# Patient Record
Sex: Female | Born: 1955
Health system: Southern US, Community
[De-identification: ages and names within clinical notes are randomized; demographics above are authoritative.]

## PROBLEM LIST (undated history)

## (undated) ENCOUNTER — Ambulatory Visit: Admission: EM | Discharge status: Absent without leave | Payer: Medicare Other

## (undated) DIAGNOSIS — M329 Systemic lupus erythematosus, unspecified: Secondary | ICD-10-CM

## (undated) DIAGNOSIS — I10 Essential (primary) hypertension: Secondary | ICD-10-CM

## (undated) DIAGNOSIS — R51 Headache: Secondary | ICD-10-CM

## (undated) DIAGNOSIS — L719 Rosacea, unspecified: Secondary | ICD-10-CM

## (undated) DIAGNOSIS — M609 Myositis, unspecified: Secondary | ICD-10-CM

## (undated) DIAGNOSIS — IMO0002 Reserved for concepts with insufficient information to code with codable children: Secondary | ICD-10-CM

## (undated) DIAGNOSIS — N189 Chronic kidney disease, unspecified: Secondary | ICD-10-CM

## (undated) HISTORY — DX: Systemic lupus erythematosus, unspecified: M32.9

## (undated) HISTORY — DX: Myositis, unspecified: M60.9

## (undated) HISTORY — DX: Reserved for concepts with insufficient information to code with codable children: IMO0002

## (undated) HISTORY — DX: Headache: R51

## (undated) HISTORY — PX: ABDOMINAL HYSTERECTOMY: SHX81

## (undated) HISTORY — DX: Essential (primary) hypertension: I10

## (undated) HISTORY — DX: Rosacea, unspecified: L71.9

## (undated) HISTORY — DX: Chronic kidney disease, unspecified: N18.9

## (undated) HISTORY — PX: TONSILLECTOMY: SUR1361

---

## 1998-01-14 ENCOUNTER — Ambulatory Visit (HOSPITAL_COMMUNITY): Admission: RE | Admit: 1998-01-14 | Discharge: 1998-01-14 | Payer: Self-pay | Admitting: Obstetrics and Gynecology

## 1998-07-12 ENCOUNTER — Inpatient Hospital Stay (HOSPITAL_COMMUNITY): Admission: AD | Admit: 1998-07-12 | Discharge: 1998-07-15 | Payer: Self-pay | Admitting: Obstetrics and Gynecology

## 1999-05-14 ENCOUNTER — Other Ambulatory Visit: Admission: RE | Admit: 1999-05-14 | Discharge: 1999-05-14 | Payer: Self-pay | Admitting: Obstetrics and Gynecology

## 2000-06-02 ENCOUNTER — Other Ambulatory Visit: Admission: RE | Admit: 2000-06-02 | Discharge: 2000-06-02 | Payer: Self-pay | Admitting: Obstetrics and Gynecology

## 2000-06-14 ENCOUNTER — Emergency Department (HOSPITAL_COMMUNITY): Admission: EM | Admit: 2000-06-14 | Discharge: 2000-06-14 | Payer: Self-pay | Admitting: Emergency Medicine

## 2001-07-20 ENCOUNTER — Other Ambulatory Visit: Admission: RE | Admit: 2001-07-20 | Discharge: 2001-07-20 | Payer: Self-pay | Admitting: Obstetrics and Gynecology

## 2002-03-21 ENCOUNTER — Emergency Department (HOSPITAL_COMMUNITY): Admission: EM | Admit: 2002-03-21 | Discharge: 2002-03-22 | Payer: Self-pay | Admitting: Emergency Medicine

## 2002-03-22 ENCOUNTER — Encounter: Payer: Self-pay | Admitting: Emergency Medicine

## 2002-03-27 ENCOUNTER — Ambulatory Visit (HOSPITAL_COMMUNITY): Admission: RE | Admit: 2002-03-27 | Discharge: 2002-03-27 | Payer: Self-pay | Admitting: Internal Medicine

## 2002-04-14 ENCOUNTER — Emergency Department (HOSPITAL_COMMUNITY): Admission: EM | Admit: 2002-04-14 | Discharge: 2002-04-14 | Payer: Self-pay | Admitting: Emergency Medicine

## 2002-04-14 ENCOUNTER — Encounter: Payer: Self-pay | Admitting: Emergency Medicine

## 2002-07-12 ENCOUNTER — Encounter: Admission: RE | Admit: 2002-07-12 | Discharge: 2002-08-17 | Payer: Self-pay

## 2002-08-02 ENCOUNTER — Other Ambulatory Visit: Admission: RE | Admit: 2002-08-02 | Discharge: 2002-08-02 | Payer: Self-pay | Admitting: Obstetrics and Gynecology

## 2002-08-05 ENCOUNTER — Emergency Department (HOSPITAL_COMMUNITY): Admission: EM | Admit: 2002-08-05 | Discharge: 2002-08-05 | Payer: Self-pay

## 2003-02-22 ENCOUNTER — Encounter: Payer: Self-pay | Admitting: Emergency Medicine

## 2003-02-22 ENCOUNTER — Inpatient Hospital Stay (HOSPITAL_COMMUNITY): Admission: EM | Admit: 2003-02-22 | Discharge: 2003-02-24 | Payer: Self-pay | Admitting: Emergency Medicine

## 2003-02-22 ENCOUNTER — Emergency Department (HOSPITAL_COMMUNITY): Admission: EM | Admit: 2003-02-22 | Discharge: 2003-02-22 | Payer: Self-pay | Admitting: Emergency Medicine

## 2003-03-06 ENCOUNTER — Encounter: Admission: RE | Admit: 2003-03-06 | Discharge: 2003-03-06 | Payer: Self-pay | Admitting: Family Medicine

## 2003-09-06 ENCOUNTER — Other Ambulatory Visit: Admission: RE | Admit: 2003-09-06 | Discharge: 2003-09-06 | Payer: Self-pay | Admitting: Obstetrics and Gynecology

## 2004-10-01 ENCOUNTER — Other Ambulatory Visit: Admission: RE | Admit: 2004-10-01 | Discharge: 2004-10-01 | Payer: Self-pay | Admitting: Obstetrics and Gynecology

## 2005-10-16 ENCOUNTER — Emergency Department (HOSPITAL_COMMUNITY): Admission: EM | Admit: 2005-10-16 | Discharge: 2005-10-16 | Payer: Self-pay | Admitting: Emergency Medicine

## 2007-12-02 ENCOUNTER — Ambulatory Visit (HOSPITAL_COMMUNITY): Payer: Self-pay | Admitting: Psychiatry

## 2007-12-20 ENCOUNTER — Ambulatory Visit (HOSPITAL_BASED_OUTPATIENT_CLINIC_OR_DEPARTMENT_OTHER): Admission: RE | Admit: 2007-12-20 | Discharge: 2007-12-20 | Payer: Self-pay | Admitting: Orthopedic Surgery

## 2008-01-06 ENCOUNTER — Ambulatory Visit (HOSPITAL_COMMUNITY): Payer: Self-pay | Admitting: Psychiatry

## 2008-03-07 ENCOUNTER — Ambulatory Visit (HOSPITAL_COMMUNITY): Payer: Self-pay | Admitting: Psychiatry

## 2008-03-26 ENCOUNTER — Emergency Department (HOSPITAL_COMMUNITY): Admission: EM | Admit: 2008-03-26 | Discharge: 2008-03-27 | Payer: Self-pay | Admitting: Emergency Medicine

## 2008-05-09 ENCOUNTER — Ambulatory Visit (HOSPITAL_COMMUNITY): Payer: Self-pay | Admitting: Psychiatry

## 2008-06-23 ENCOUNTER — Emergency Department (HOSPITAL_COMMUNITY): Admission: EM | Admit: 2008-06-23 | Discharge: 2008-06-23 | Payer: Self-pay | Admitting: Emergency Medicine

## 2008-07-09 ENCOUNTER — Ambulatory Visit (HOSPITAL_COMMUNITY): Payer: Self-pay | Admitting: Psychiatry

## 2008-09-26 ENCOUNTER — Ambulatory Visit (HOSPITAL_COMMUNITY): Payer: Self-pay | Admitting: Psychiatry

## 2008-11-21 ENCOUNTER — Ambulatory Visit (HOSPITAL_COMMUNITY): Payer: Self-pay | Admitting: Psychiatry

## 2009-01-21 ENCOUNTER — Ambulatory Visit (HOSPITAL_COMMUNITY): Payer: Self-pay | Admitting: Psychiatry

## 2009-03-13 ENCOUNTER — Ambulatory Visit (HOSPITAL_COMMUNITY): Payer: Self-pay | Admitting: Psychiatry

## 2009-05-08 ENCOUNTER — Ambulatory Visit (HOSPITAL_COMMUNITY): Payer: Self-pay | Admitting: Psychiatry

## 2009-07-12 ENCOUNTER — Ambulatory Visit (HOSPITAL_COMMUNITY): Payer: Self-pay | Admitting: Psychiatry

## 2009-09-11 ENCOUNTER — Ambulatory Visit (HOSPITAL_COMMUNITY): Payer: Self-pay | Admitting: Psychiatry

## 2009-11-13 ENCOUNTER — Ambulatory Visit (HOSPITAL_COMMUNITY): Payer: Self-pay | Admitting: Psychiatry

## 2010-01-06 ENCOUNTER — Ambulatory Visit (HOSPITAL_COMMUNITY): Payer: Self-pay | Admitting: Psychiatry

## 2010-04-07 ENCOUNTER — Ambulatory Visit (HOSPITAL_COMMUNITY): Payer: Self-pay | Admitting: Psychiatry

## 2010-05-19 ENCOUNTER — Encounter
Admission: RE | Admit: 2010-05-19 | Discharge: 2010-08-17 | Payer: Self-pay | Source: Home / Self Care | Attending: Physical Medicine and Rehabilitation | Admitting: Physical Medicine and Rehabilitation

## 2010-05-19 ENCOUNTER — Encounter
Admission: RE | Admit: 2010-05-19 | Discharge: 2010-05-19 | Payer: Self-pay | Admitting: Physical Medicine & Rehabilitation

## 2010-05-23 ENCOUNTER — Ambulatory Visit: Payer: Self-pay | Admitting: Physical Medicine and Rehabilitation

## 2010-05-28 ENCOUNTER — Emergency Department (HOSPITAL_COMMUNITY): Admission: EM | Admit: 2010-05-28 | Discharge: 2010-05-28 | Payer: Self-pay | Admitting: Emergency Medicine

## 2010-06-09 ENCOUNTER — Ambulatory Visit (HOSPITAL_COMMUNITY): Payer: Self-pay | Admitting: Psychiatry

## 2010-06-20 ENCOUNTER — Ambulatory Visit: Payer: Self-pay | Admitting: Physical Medicine and Rehabilitation

## 2010-07-28 ENCOUNTER — Ambulatory Visit: Payer: Self-pay | Admitting: Physical Medicine and Rehabilitation

## 2010-08-13 ENCOUNTER — Ambulatory Visit (HOSPITAL_COMMUNITY): Payer: Self-pay | Admitting: Psychiatry

## 2010-08-18 ENCOUNTER — Encounter
Admission: RE | Admit: 2010-08-18 | Discharge: 2010-08-26 | Payer: Self-pay | Source: Home / Self Care | Attending: Physical Medicine and Rehabilitation | Admitting: Physical Medicine and Rehabilitation

## 2010-08-26 ENCOUNTER — Ambulatory Visit: Payer: Self-pay | Admitting: Physical Medicine and Rehabilitation

## 2010-10-01 ENCOUNTER — Ambulatory Visit: Payer: Medicare Other | Attending: Physical Medicine and Rehabilitation

## 2010-10-01 ENCOUNTER — Ambulatory Visit: Admit: 2010-10-01 | Payer: Self-pay | Admitting: Physical Medicine and Rehabilitation

## 2010-10-01 ENCOUNTER — Ambulatory Visit: Payer: Medicare Other | Admitting: Physical Medicine and Rehabilitation

## 2010-10-01 DIAGNOSIS — G4700 Insomnia, unspecified: Secondary | ICD-10-CM | POA: Insufficient documentation

## 2010-10-01 DIAGNOSIS — M3320 Polymyositis, organ involvement unspecified: Secondary | ICD-10-CM | POA: Insufficient documentation

## 2010-10-01 DIAGNOSIS — M545 Low back pain, unspecified: Secondary | ICD-10-CM | POA: Insufficient documentation

## 2010-10-01 DIAGNOSIS — M810 Age-related osteoporosis without current pathological fracture: Secondary | ICD-10-CM | POA: Insufficient documentation

## 2010-10-01 DIAGNOSIS — F329 Major depressive disorder, single episode, unspecified: Secondary | ICD-10-CM | POA: Insufficient documentation

## 2010-10-01 DIAGNOSIS — M542 Cervicalgia: Secondary | ICD-10-CM

## 2010-10-01 DIAGNOSIS — Z79899 Other long term (current) drug therapy: Secondary | ICD-10-CM | POA: Insufficient documentation

## 2010-10-01 DIAGNOSIS — F411 Generalized anxiety disorder: Secondary | ICD-10-CM | POA: Insufficient documentation

## 2010-10-01 DIAGNOSIS — M329 Systemic lupus erythematosus, unspecified: Secondary | ICD-10-CM | POA: Insufficient documentation

## 2010-10-01 DIAGNOSIS — G894 Chronic pain syndrome: Secondary | ICD-10-CM

## 2010-10-01 DIAGNOSIS — F3289 Other specified depressive episodes: Secondary | ICD-10-CM | POA: Insufficient documentation

## 2010-10-01 DIAGNOSIS — G35 Multiple sclerosis: Secondary | ICD-10-CM | POA: Insufficient documentation

## 2010-10-15 ENCOUNTER — Encounter (HOSPITAL_COMMUNITY): Payer: Self-pay | Admitting: Psychiatry

## 2010-10-28 ENCOUNTER — Encounter: Payer: Medicare Other | Attending: Physical Medicine and Rehabilitation

## 2010-10-28 ENCOUNTER — Ambulatory Visit: Payer: Medicare Other

## 2010-10-28 DIAGNOSIS — M129 Arthropathy, unspecified: Secondary | ICD-10-CM | POA: Insufficient documentation

## 2010-10-28 DIAGNOSIS — M542 Cervicalgia: Secondary | ICD-10-CM | POA: Insufficient documentation

## 2010-10-28 DIAGNOSIS — M545 Low back pain, unspecified: Secondary | ICD-10-CM

## 2010-10-28 DIAGNOSIS — M329 Systemic lupus erythematosus, unspecified: Secondary | ICD-10-CM | POA: Insufficient documentation

## 2010-10-28 DIAGNOSIS — M3320 Polymyositis, organ involvement unspecified: Secondary | ICD-10-CM | POA: Insufficient documentation

## 2010-10-28 DIAGNOSIS — Z79899 Other long term (current) drug therapy: Secondary | ICD-10-CM | POA: Insufficient documentation

## 2010-10-28 DIAGNOSIS — G894 Chronic pain syndrome: Secondary | ICD-10-CM

## 2010-10-28 DIAGNOSIS — G35 Multiple sclerosis: Secondary | ICD-10-CM | POA: Insufficient documentation

## 2010-10-28 DIAGNOSIS — M79609 Pain in unspecified limb: Secondary | ICD-10-CM

## 2010-10-29 ENCOUNTER — Encounter (HOSPITAL_COMMUNITY): Payer: Medicare Other | Admitting: Psychiatry

## 2010-10-29 DIAGNOSIS — F329 Major depressive disorder, single episode, unspecified: Secondary | ICD-10-CM

## 2010-11-17 NOTE — Assessment & Plan Note (Signed)
Jackie Goodman is a pleasant 55 year old African American woman who is the patient of Dr. Mirna Mires.  [name] has returned to our [facility] for refill of her Percocet.  She has a complicated pain history which includes the diagnosis of polymyositis, systemic lupus erythematosus, as well as multiple sclerosis.  She also had a recent fall on May 27, 2010, when she was walking in the parking lot of the grocery store and she slipped and fell.  She landed on her hands and twisted and fell on her right knee as well.  She has been managed by Dr. Otelia Sergeant for this acute issue.  She tells me that she recently saw him.  He also ordered an MRI of her right knee without contrast.  The results of this scan are attached to the chart. The exam date is labeled July 22, 2010.  Briefly, some marrow edema was noted in the lateral aspect of the anterior tibia which was compatible with resolving contusion without discrete fracture, negative for meniscal or ligament tear, and mild degenerative changes noted.  She tells me an electrodiagnostic study is ordered for August 11, 2010.  [name] also complains of some intermittent neck pain, low back pain, and some continued right leg pain.  Average pain is about a 9 on a scale of 10.  She indicates on her pain inventory that she is getting good relief with current meds.  Pain is typically worse when she is up, active walking, or standing; improves with pacing her activities and medications.  FUNCTIONAL STATUS:  She can walk 20 minutes at a time.  She can climb stairs.  She is driving.  She is independent with self-care and needs assistance with heavier household tasks.  Denies problems controlling bowel or bladder.  Admits to some weakness, depression, and anxiety. Denies suicidal ideation.  Had some recent problems with nausea and uses Phenergan for that recently in the last week or so.  Past medical, social, and  family history are unchanged other than that already noted.  [facility] include Percocet 5/325, one p.o. t.i.d. p.r.n. knee pain.  Pill counts indicates fill date of July 11, 2010.  Narcotic count today shows 29 pills, indicating that she is approximately 10 pills short of what she should have.  This was discussed with her.  She tells me she took a few extra pills over the last couple of weeks.  We discussed the importance of using the pain medications as directed.  She states to me she will comply.  Urine drug screen is ordered for today as well.  PHYSICAL EXAMINATION:  VITAL SIGNS:  On exam today, her blood pressure was 131/77, pulse was 94, respirations 20, 99% saturated on room air. GENERAL:  She is a well-developed, well-nourished woman who does not appear in any distress.  She is oriented x3.  Speech is clear.  Affect is bright.  She is alert, cooperative, and pleasant.  Follows commands without difficulty.  Answers my questions appropriately. NEUROLOGIC:  Cranial nerves, coordination are intact. MUSCULOSKELETAL:  Her reflexes are 2+ in both upper and lower extremities without abnormal tone, clonus, or tremors.  No sensory deficits are appreciated with light touch.  Vibratory sensation is intact in the lower extremities as well.  Motor strength is generally 5/5 in both lower extremities without deficits including EHL, evertors, and dorsiflexors as well as plantar flexors.  Straight leg raise is negative.  She transitions without difficulty from  sitting to standing. Gait is normal, tandem gait.  Romberg tests are performed adequately. She has some limitations in lumbar motion especially forward flexion, complains of some pain with extension.  IMPRESSION: 1. New problem, right knee contusion.  Results of MRI done July 22, 2010 are attached to chart, this was ordered by her     orthopedist, Dr. Otelia Sergeant.  Electrodiagnostic studies are  apparently     planned for December 2011 as well. 2. History of cervicalgia x18 years. 3. History of left shoulder surgery back in 2010, with relatively well-     preserved range of motion. 4. Lumbago x5 years which is exacerbated with recent fall, showing     some diminished range of motion.  New complaint is a mild     intermittent tingling in the right lower extremity and continued     pain. 5. History of osteoporosis. 6. History of multiple sclerosis. 7. History of polymyositis. 8. History of systemic lupus erythematosus.  PLAN:  Discussion regarding pill counts as noted above today.  Urine drug screen obtained.  Also of note on exam, she had some tenderness over both trochanters, worse on the right than on the left and down the iliotibial band as well.  Discussion regarding trochanteric bursitis, iliotibial band syndrome was done as well.  At this point, the patient would like to continue current medication. Apparently, Dr. Otelia Sergeant is planning some physical therapy for Ms. Goding per her report today.  We discussed considering injection for the trochanteric bursitis as well.  She would like to hold off until after physical therapy has addressed some initial issues.  Nursing staff to moniter pill counts and refill meds next month.  I will see her back in January 2012.  I have answered all her questions.  She is comfortable with our current treatment plan.     Brantley Stage, M.D. Electronically Signed    DMK/MedQ D:  07/28/2010 10:16:38  T:  07/28/2010 23:00:18  Job #:  [id number]

## 2010-11-26 ENCOUNTER — Other Ambulatory Visit: Payer: Self-pay | Admitting: Physical Medicine and Rehabilitation

## 2010-11-26 ENCOUNTER — Encounter: Payer: Medicare Other | Attending: Physical Medicine and Rehabilitation

## 2010-11-26 ENCOUNTER — Ambulatory Visit: Payer: Medicare Other | Admitting: Physical Medicine and Rehabilitation

## 2010-11-26 DIAGNOSIS — G35 Multiple sclerosis: Secondary | ICD-10-CM | POA: Insufficient documentation

## 2010-11-26 DIAGNOSIS — M545 Low back pain, unspecified: Secondary | ICD-10-CM

## 2010-11-26 DIAGNOSIS — Z79899 Other long term (current) drug therapy: Secondary | ICD-10-CM | POA: Insufficient documentation

## 2010-11-26 DIAGNOSIS — M810 Age-related osteoporosis without current pathological fracture: Secondary | ICD-10-CM | POA: Insufficient documentation

## 2010-11-26 DIAGNOSIS — M329 Systemic lupus erythematosus, unspecified: Secondary | ICD-10-CM | POA: Insufficient documentation

## 2010-11-26 DIAGNOSIS — M79609 Pain in unspecified limb: Secondary | ICD-10-CM

## 2010-11-26 DIAGNOSIS — M25559 Pain in unspecified hip: Secondary | ICD-10-CM | POA: Insufficient documentation

## 2010-11-26 DIAGNOSIS — M542 Cervicalgia: Secondary | ICD-10-CM | POA: Insufficient documentation

## 2010-11-26 DIAGNOSIS — M3320 Polymyositis, organ involvement unspecified: Secondary | ICD-10-CM | POA: Insufficient documentation

## 2010-12-05 ENCOUNTER — Ambulatory Visit (HOSPITAL_COMMUNITY)
Admission: RE | Admit: 2010-12-05 | Discharge: 2010-12-05 | Disposition: A | Discharge status: Home / Self Care | Payer: Medicare Other | Source: Ambulatory Visit | Attending: Physical Medicine and Rehabilitation | Admitting: Physical Medicine and Rehabilitation

## 2010-12-05 DIAGNOSIS — M545 Low back pain, unspecified: Secondary | ICD-10-CM | POA: Insufficient documentation

## 2010-12-22 ENCOUNTER — Encounter (HOSPITAL_COMMUNITY): Payer: Medicare Other | Admitting: Psychiatry

## 2010-12-22 DIAGNOSIS — F329 Major depressive disorder, single episode, unspecified: Secondary | ICD-10-CM

## 2010-12-29 ENCOUNTER — Encounter
Payer: Medicare Other | Attending: Physical Medicine and Rehabilitation | Admitting: Physical Medicine and Rehabilitation

## 2010-12-29 DIAGNOSIS — G35 Multiple sclerosis: Secondary | ICD-10-CM | POA: Insufficient documentation

## 2010-12-29 DIAGNOSIS — M3320 Polymyositis, organ involvement unspecified: Secondary | ICD-10-CM | POA: Insufficient documentation

## 2010-12-29 DIAGNOSIS — M542 Cervicalgia: Secondary | ICD-10-CM

## 2010-12-29 DIAGNOSIS — M545 Low back pain, unspecified: Secondary | ICD-10-CM

## 2010-12-29 DIAGNOSIS — G894 Chronic pain syndrome: Secondary | ICD-10-CM

## 2010-12-29 DIAGNOSIS — M51379 Other intervertebral disc degeneration, lumbosacral region without mention of lumbar back pain or lower extremity pain: Secondary | ICD-10-CM | POA: Insufficient documentation

## 2010-12-29 DIAGNOSIS — M329 Systemic lupus erythematosus, unspecified: Secondary | ICD-10-CM | POA: Insufficient documentation

## 2010-12-29 DIAGNOSIS — G8929 Other chronic pain: Secondary | ICD-10-CM | POA: Insufficient documentation

## 2010-12-29 DIAGNOSIS — M5137 Other intervertebral disc degeneration, lumbosacral region: Secondary | ICD-10-CM | POA: Insufficient documentation

## 2010-12-30 NOTE — Assessment & Plan Note (Signed)
Jackie Goodman is back in today for a refill of her pain medication.  She is a patient of Dr. Mirna Mires.  She has multiple medical conditions including polymyositis, systemic lupus erythematosus as well as multiple sclerosis.  On the last visit, she was complaining of right lower extremity pain and had some weakness at the hip flexors as well as increased back pain. Lumbar MRI was carried out on December 05, 2010.  The results are reviewed with her today.  She has some mild to moderate facet hypertrophy at L2-3 and moderate to severe facet hypertrophy at L3-4 with fluid in the right facet joint.  At L4-5, she has degenerative disk disease as well as facet changes, right L4 foraminal stenosis noted as well, moderate left as well as mild right L4 foraminal stenosis is noted.  The results are reviewed with her.  In the interim, she has reported some improvement in her strength and reports her average pain at about a 7 on a scale of 10.  FUNCTIONAL STATUS:  She can walk about 50 minutes at a time.  She can climb stairs.  She is driving.  She is independent with self-care.  She is reporting good relief with current medications.  She is taking 3 Percocet a day, but has discontinued the gabapentin due to the sedating effects she has felt during the day.  REVIEW OF SYSTEMS:  Otherwise positive for occasional weakness and trouble walking.  No other complaints are noted.  PAST MEDICAL, SOCIAL, AND FAMILY HISTORY:  Unchanged.  PHYSICAL EXAMINATION:  Blood pressure is 128/68, pulse 58, respiration 18, and 100% saturated on room air.  She is a well-developed, well- nourished woman who does not appear in any distress.  She is oriented x3.  Speech is clear.  Affect is bright.  She is alert, cooperative, and pleasant.  Follows commands without difficulty.  Answers my questions appropriately.  Cranial nerves and coordination are intact.  Her reflexes are intact in the upper and lower extremities.  No  abnormal tone, clonus, or tremors are noted.  Her motor strength is good today without obvious focal deficits at hip flexors, knee extensors, and dorsiflexors as well as knee flexors.  There is no numbness appreciated today.  She has some mild limitations with rotation of her neck to the left.  Reports some pain with some discomfort with left lateral flexion.  Lumbar motion is fairly well preserved, but complains of some end-range stiffness.  Gait is otherwise normal.  Her balance is good.  Tandem gait and Romberg test are performed adequately.  IMPRESSION: 1. History of cervicalgia x18 years. 2. History of multiple sclerosis. 3. History of polymyositis. 4. History of lupus erythematosus. 5. Chronic low back pain with recent MRI showing some degenerative     changes at multiple levels.  It is possible her weakness may have been secondary to her multiple sclerosis.  This has resolved.  Her pain is somewhat improved in the low back.  She still continues to have cervicalgia.  She found the Neurontin too sedating for daytime.  She might use it at night.  However, Percocet, she takes 3 tablets a day.  She is doing well on that.  She does not exhibit any aberrant behavior.  Her pill counts are appropriate.  We will continue to monitor urine drug screens.  Her last urine drug screen was November 2011 which was consistent.  I have answered all of her questions.  I have also suggested to use heat, a  soft collar, icy hot for neck pain exacerbations.  She is comfortable with our management plan.     Brantley Stage, M.D.    DMK/MedQ D:  12/29/2010 14:03:44  T:  12/30/2010 02:10:24  Job #:  [id number]

## 2011-01-13 NOTE — Op Note (Signed)
NAMEJESSAMINE, BARCIA               ACCOUNT NO.:  [id number]   MEDICAL RECORD NO.:  [id number]          PATIENT TYPE:  AMB   LOCATION:  DSC                          FACILITY:  [facility]   PHYSICIAN:  Katy Fitch. Sypher, M.D. DATE OF BIRTH:  1956-05-24   DATE OF PROCEDURE:  12/11/2007  DATE OF DISCHARGE:                               OPERATIVE REPORT   PREOPERATIVE DIAGNOSES:  Chronic left shoulder pain, status post AC  separation with subsequent development of glenohumeral pain with MRI  evidence of advanced glenohumeral degenerative arthritis and probable  multiple loose bodies as well as AC degenerative arthritis.   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   OPERATION:  1. Diagnostic arthroscopy, left shoulder with arthroscopic synovectomy      and labral debridement and limited anterior capsulectomy with      tenolysis of the subscapularis tendon, and debridement of the      subscapularis tendon for tendinosis.  2. Arthroscopic subacromial decompression with extensive bursectomy,      acromioplasty, and relaxation of coracoacromial ligament.  3. Arthroscopic distal clavicle resection.   OPERATIONS:  Katy Fitch. Sypher, MD.   ASSISTANT:  Marveen Reeks Dasnoit, PA-C   ANESTHESIA:  General endotracheal supplemented by a left interscalene  block, supervising anesthesiologist is Dr. Sampson Goon.   INDICATIONS:  Jackie Goodman is a 55 year old woman with chronic  systemic lupus on disability.  She has had chronic left shoulder pain  for approximately 1 year following an injury.  She was initially  evaluated by Dr. Thamas Jaegers in [facility] and was thought to have an [facility]  separation.  Her  shoulder pain persisted despite rest, analgesic  medication, and rehabilitation exercises.  An MRI of the shoulder  documented evidence of AC arthropathy, glenohumeral degenerative change,  and loose bodies within the glenohumeral joint.   Due to her failure to respond to nonoperative measures, Ms. Record was  advised to consider diagnostic arthroscopy anticipating arthroscopic  joint debridement, synovectomy, and removal of loose bodies if they were  identified during diagnostic arthroscopy.  In addition, she was noted to  have unfavorable acromial and AC anatomy and was advised to consider  arthroscopic subacromial decompression and distal clavicle resection.   Preoperatively, the MRI suggested that a rotator cuff was intact.   After informed consent, she was brought to the operating room at this  time.   PROCEDURE:  Jackie Goodman is brought to the operating room and placed  in supine position on the operating table.   Following an anesthesia consult with Dr. Sampson Goon in the holding area,  general anesthesia was recommended and accepted as well as a left  interscalene block.  Dr. Sampson Goon placed the interscalene block in  holding area without complication.   Ms. Lastra was brought to room 6 of the [facility], placed in  supine position upon the operating table, and under Dr. Jarrett Ables  direct supervision, general endotracheal anesthesia induced.  She was  carefully positioned in the beach-chair position where they have a torso  and head holder designed for shoulder arthroscopy.   The entire left arm and forequarter were prepped with DuraPrep and  draped with impervious arthroscopy drapes.   The arthroscope was introduced through a standard posterior viewing  portal after distention of the shoulder joint with 20 mL of sterile  saline placed by a __________ spinal needle anteriorly.  Diagnostic  arthroscopy revealed moderate synovitis throughout the  glenohumeral  joint with degenerative changes in the labrum from approximately 3  o'clock anteriorly to 10 o'clock posteriorly.  She had a [place]  complex anteriorly and some adhesions to the anterior capsule.  The  superior 20% of the subscapularis was degenerative with __________  tendinopathy.   An anterior portal was created under direct vision followed by thorough  debridement of the labrum, the subscapularis tendinopathy, and other  debris within the joint, including synovitis.  A limited capsulectomy  and tenolysis of the subscapularis tendon was accomplished.  The glenoid  had grade 3 chondromalacia that was debrided with abrasion chondroplasty  to a smooth margin using a 4.5-mm suction shaver.  The humeral head had  grade 2 and 3 chondromalacia on the primary articular surface against  the glenoid.  This was likewise debrided with a 4.5-mm suction shaver to  a smooth margin.  The inferior recess of the shoulder was inspected.  I  could not identify any loose bodies.   We explored the subscapularis bursa and also could not find loose  bodies.   The arthroscope was removed from the glenohumeral joint and placed in  the subacromial space.  The bursa was adherent to the deep surface to  the acromion, and there was very poor clearance between the  supraspinatus tendon and the [facility] joint.  After rather thorough  bursectomy, the scope was used to identify the anatomy of the  coracoacromial arch.  The anterior medial acromion was prominent.  This  was cleared with the electrocautery device followed by leveling of the  acromion to a type 1 morphology with relaxation of the coracoacromial  ligament.  The AC capsule was taken down and the distal clavicle found  be degenerative.  The distal 15 mm of clavicle was removed  arthroscopically with a suction bur.  Hemostasis was achieved with  bipolar cautery.  The suction shaver was used to debride bursa and  debris from the subacromial  space.  After decompression, the cuff  appeared to be intact.   Bleeding points were electrocauterized with bipolar current followed by  removal of the arthroscopic equipment and repair of the portals with  mattress suture of 3-0 Prolene.   COMPLICATIONS:  There were no apparent complications.   Ms. Lawlor tolerated the surgery and anesthesia well.  She is transferred  to the recovery room with stable signs.   She will be discharged on prescriptions for Dilaudid 2 mg 1-2 tablets  p.o. q. 4-6 hours p.r.n. pain.  She is provided 30 tablets without  refill.  She is also provided a prescription for Keflex 500 mg one p.o.  q.8 hours x4 days as a  prophylactic antibiotic.  She is encouraged to turn to our office for  follow up in 24 hours for dressing change and initiation in a  postoperative exercise program.   She will be discharged to the care of her  family with the aforementioned  prescriptions.       Katy Fitch Sypher, M.D.  Electronically Signed     RVS/MEDQ  D:  12/20/2007  T:  12/21/2007  Job:  [id number]
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
## 2011-01-28 ENCOUNTER — Encounter: Payer: Medicare Other | Attending: Physical Medicine and Rehabilitation

## 2011-01-28 DIAGNOSIS — M545 Low back pain, unspecified: Secondary | ICD-10-CM

## 2011-01-28 DIAGNOSIS — M542 Cervicalgia: Secondary | ICD-10-CM | POA: Insufficient documentation

## 2011-01-28 DIAGNOSIS — G8929 Other chronic pain: Secondary | ICD-10-CM | POA: Insufficient documentation

## 2011-01-28 DIAGNOSIS — M79609 Pain in unspecified limb: Secondary | ICD-10-CM

## 2011-01-28 DIAGNOSIS — M51379 Other intervertebral disc degeneration, lumbosacral region without mention of lumbar back pain or lower extremity pain: Secondary | ICD-10-CM | POA: Insufficient documentation

## 2011-01-28 DIAGNOSIS — M3320 Polymyositis, organ involvement unspecified: Secondary | ICD-10-CM | POA: Insufficient documentation

## 2011-01-28 DIAGNOSIS — G35 Multiple sclerosis: Secondary | ICD-10-CM | POA: Insufficient documentation

## 2011-01-28 DIAGNOSIS — G894 Chronic pain syndrome: Secondary | ICD-10-CM

## 2011-01-28 DIAGNOSIS — M329 Systemic lupus erythematosus, unspecified: Secondary | ICD-10-CM | POA: Insufficient documentation

## 2011-01-28 DIAGNOSIS — M5137 Other intervertebral disc degeneration, lumbosacral region: Secondary | ICD-10-CM | POA: Insufficient documentation

## 2011-01-28 NOTE — Assessment & Plan Note (Signed)
Jackie Goodman is a pleasant 55 year old African American woman who has a past medical history which is significant for history of multiple sclerosis, lupus erythematosus and polymyositis.  She is followed at [facility] for these problems.  She is seen here at [facility] for pain complaints which include cervicalgia and low back pain.  However, she reports that she has had over the last month some increasing pain in her right thigh region worse when she is up standing and walking.  Her pain varies from a 0 up to 8 or 9 on a scale of 10, worse with activities, improves somewhat with rest and medication.  Average pain in the low back and neck between 7/10.  She does mention that over the last year or so she may have noted some weakness in the right lower extremity as well.  She is here for a refill of her Percocet, she takes 5/325 up to three times today.  She can walk about 15 minutes at a time.  She can climb stairs and drive.  She is independent with self-care.  She admits to some anxiety. Denies suicidal ideation.  PAST MEDICAL, SOCIAL AND FAMILY HISTORY:  Unchanged.  PHYSICAL EXAMINATION:  VITAL SIGNS:  Blood pressure is 131/80, pulse 62, respiration 18, 96% saturation on room air. GENERAL:  She is a well-developed, well-nourished woman with some alopecia wearing a wig. NEUROLOGIC:  She is oriented x3.  Speech is clear.  Affect is bright. She is alert, cooperative and pleasant.  Follows commands without difficulty, answers my questions appropriately.  Cranial nerves coordination are intact.  The reflexes are intact in upper and lower extremities and no abnormal tone, clonus or tremors are noted.  Motor strength is noted to be somewhat weaker in the right lower extremity with hip flexion, especially 4+/5.  Left lower extremity is intact with respect to strength.  She reports a sensory deficit in the right lower extremity over the anterior thigh and somewhat into the medial leg  below the knee.  She transitions from sitting to standing without difficulty.  She has minimal problems with balance and tandem gait.  Romberg test is also performed adequately.  IMPRESSION: 1. History of cervicalgia x18 years, new problem of right thigh pain     and hip flexion weakness notable especially over the last month but     may have had some weakness longer than this. 2. History of multiple sclerosis. 3. History of polymyositis. 4. History of lupus erythematosus.  The patient's medical problems also include osteoporosis.  She is followed at [facility] and has specialist that manages these other medical problems.  PLAN:  Consider a repeat MRI given new weakness as well as increased back pain over the last month.  We will see her back after the scan.  I have answered all her questions.  She has been taking her medications as prescribed.  No evidence of aberrant behavior.     Brantley Stage, M.D. Electronically Signed    DMK/MedQ D:  11/26/2010 13:16:02  T:  11/26/2010 22:22:55  Job #:  [id number]

## 2011-02-23 ENCOUNTER — Encounter (INDEPENDENT_AMBULATORY_CARE_PROVIDER_SITE_OTHER): Payer: Medicare Other | Admitting: Psychiatry

## 2011-02-23 DIAGNOSIS — F329 Major depressive disorder, single episode, unspecified: Secondary | ICD-10-CM

## 2011-02-23 DIAGNOSIS — F3289 Other specified depressive episodes: Secondary | ICD-10-CM

## 2011-02-25 ENCOUNTER — Encounter
Payer: Medicare Other | Attending: Physical Medicine and Rehabilitation | Admitting: Physical Medicine and Rehabilitation

## 2011-02-25 DIAGNOSIS — M329 Systemic lupus erythematosus, unspecified: Secondary | ICD-10-CM | POA: Insufficient documentation

## 2011-02-25 DIAGNOSIS — M129 Arthropathy, unspecified: Secondary | ICD-10-CM | POA: Insufficient documentation

## 2011-02-25 DIAGNOSIS — G35 Multiple sclerosis: Secondary | ICD-10-CM | POA: Insufficient documentation

## 2011-02-25 DIAGNOSIS — M545 Low back pain, unspecified: Secondary | ICD-10-CM

## 2011-02-25 DIAGNOSIS — M48061 Spinal stenosis, lumbar region without neurogenic claudication: Secondary | ICD-10-CM | POA: Insufficient documentation

## 2011-02-25 DIAGNOSIS — M542 Cervicalgia: Secondary | ICD-10-CM

## 2011-02-25 DIAGNOSIS — M3320 Polymyositis, organ involvement unspecified: Secondary | ICD-10-CM | POA: Insufficient documentation

## 2011-02-25 DIAGNOSIS — G894 Chronic pain syndrome: Secondary | ICD-10-CM

## 2011-02-25 NOTE — Assessment & Plan Note (Signed)
Jackie Goodman is a pleasant 55 year old African American woman who is a patient of Dr. Mirna Mires.  She is also followed by a specialist at [facility] in Neurology, Rheumatology as well as Nephrology.  Her last appointment there were in March 2012.  She has a complicated medical history which includes diagnosis of multiple sclerosis, polymyositis, systemic lupus erythematosus as well.  Jackie Goodman is back in today for refill of her pain medications.  She also has chronic neck and low back pain.  Her last MRI was done in April of this year.  She has rather severe facet hypertrophy, especially at L3-4 and some stenosis at L4-5 affecting, especially right L4 nerve root intermittently.  She had some weakness in the right quadriceps a few months ago.  This is overall improved.  She is not really having significant leg pain at this time. Her biggest complaint is intermittent neck pain.  She has been using some heat on this recently.  She also presents with a left swollen eyelids and she is planning to see Urgent Care after our visit here today.  Pain inventory reflects average pain about a 7 on a scale of 10.  She reports fair relief with use of Percocet up to 3 times a day.  She has been on Neurontin in the past and finds it not to be particularly helpful and too sedating for her.  Functional status is overall fairly good.  She is able to walk about 20 minutes at a time.  She is limited by fatigue and some low back pain which is rather delayed in onset after she has been walking.  She is able to participate in light household tasks.  She is independent with self-care.  She does drive.  She denies problems with bowel or bladder.  Denies suicidal ideation. Admits to some anxiety and occasional weakness and tingling.  There is no other changes in social or family history.  She does smoke about a third of a pack a day, cautioned her regarding this.  Medications prescribed through [facility] include Percocet 5/325 three times a day p.r.n. back, neck or knee pain.  On exam, her blood pressure is 122/90, pulse 70, respirations 16.  She is a well developed, well nourished [place] woman who does not appear in any distress.  She does have an obviously swollen left eyelid.  She is oriented x3.  Speech is clear.  Affect is bright.  She is alert, cooperative and pleasant.  Follows commands without difficulty.  Answers my questions appropriately.  Cranial nerves and coordination are intact.  Finger-nose-finger was performed without difficulty.  No new sensory changes are appreciated.  On motor testing, she has good strength in upper as well as lower extremities with the exception of right hip flexors are about 4+/5.  Her reflexes are 2+ in upper and lower extremities.  No abnormal tone, clonus, or tremors are noted.  Hoffman sign is negative as well.  She is able to transition easily from sitting to standing.  Gait is not antalgic and stable.  Tandem gait and Romberg test are all performed adequately.  She has some limitations with rotation of her head to the right.  She reports some discomfort in the low back with forward flexion.  She has some limitations here as well.  IMPRESSION: 1. History of cervicalgia x18 years. 2. Chronic low back pain with known facet arthropathy as well as     degenerative disk changes and some stenosis  at L4-5, especially on     the right. 3. History of multiple sclerosis diagnosed back in the early 1990s,     history of polymyositis 4. History of lupus erythematosus.  I have encouraged the patient to follow up with Urgent Care for her left eyelid swelling.  I have also encouraged her to maintain contact with her specialist at [facility] for her polymyositis, lupus and MS, as well as her renal specialist.  We reviewed treatment options for her various chronic pain complaints including pacing her activities with the use of modalities  and soft collar.  I have encouraged her to maintain her activity levels which include walking and daily functional activities.  She has not displayed any aberrant behavior with the use of her pain medications, her pill counts have been appropriate.  Her last urine drug screen was back in December 2011 and was consistent.  She has another urine drug screen pending after today.  I have answered all her questions.  She is comfortable with our plan.  She will be following up with our nurse practitioner Kallie Edward over the next 2 months and I will see her back in the fall.  She is comfortable with our treatment plan.     Brantley Stage, M.D. Electronically Signed    DMK/MedQ D:  02/25/2011 09:20:54  T:  02/25/2011 22:53:09  Job #:  [id number]

## 2011-03-11 ENCOUNTER — Ambulatory Visit (HOSPITAL_COMMUNITY): Payer: Medicare Other | Admitting: Licensed Clinical Social Worker

## 2011-03-13 ENCOUNTER — Ambulatory Visit (HOSPITAL_COMMUNITY): Payer: Medicare Other | Admitting: Licensed Clinical Social Worker

## 2011-03-25 ENCOUNTER — Encounter: Payer: Medicare Other | Attending: Physical Medicine and Rehabilitation | Admitting: Neurosurgery

## 2011-03-25 DIAGNOSIS — G35 Multiple sclerosis: Secondary | ICD-10-CM | POA: Insufficient documentation

## 2011-03-25 DIAGNOSIS — M5137 Other intervertebral disc degeneration, lumbosacral region: Secondary | ICD-10-CM | POA: Insufficient documentation

## 2011-03-25 DIAGNOSIS — M329 Systemic lupus erythematosus, unspecified: Secondary | ICD-10-CM | POA: Insufficient documentation

## 2011-03-25 DIAGNOSIS — M542 Cervicalgia: Secondary | ICD-10-CM | POA: Insufficient documentation

## 2011-03-25 DIAGNOSIS — G8929 Other chronic pain: Secondary | ICD-10-CM | POA: Insufficient documentation

## 2011-03-25 DIAGNOSIS — M545 Low back pain, unspecified: Secondary | ICD-10-CM | POA: Insufficient documentation

## 2011-03-25 DIAGNOSIS — M3320 Polymyositis, organ involvement unspecified: Secondary | ICD-10-CM | POA: Insufficient documentation

## 2011-03-25 DIAGNOSIS — M51379 Other intervertebral disc degeneration, lumbosacral region without mention of lumbar back pain or lower extremity pain: Secondary | ICD-10-CM | POA: Insufficient documentation

## 2011-03-25 NOTE — Assessment & Plan Note (Signed)
ACCOUNT:  [id number].  This is the patient of Dr. Pamelia Hoit who is seen here for generalized pain.  She has cervical pain that radiates down to the low back.  She has also seen at [facility] in Neurology and Rheumatology, and has a history of polymyositis as well as systemic lupus.  She rates her pain at about a 7-8, it is a sharp burning type pain with aching sensations.  Her activity level is 7-8.  His pain is worse in the morning, evening and night.  Sleep patterns are fair.  Pain is worse with walking, bending, and most activities.  Rest, pacing and medication tend to help.  She walks without assistance.  She walk about 15 minutes at a time.  She climb steps and drive.  She is on disability.  REVIEW OF SYSTEMS:  Notable for difficulties described above as well as some weakness and anxiety.  No suicidal thoughts or aberrant behaviors. Her Oswestry score was 50.  PAST MEDICAL HISTORY:  Unchanged.  SOCIAL HISTORY:  She is single.  FAMILY HISTORY:  Unchanged.  PHYSICAL EXAMINATION:  Blood pressure 121/80, pulse 60, respirations 16. Her O2 sat was not obtainable due to her nail polish.  Constitutionally, she is within normal limits.  She is alert and oriented x3.  She has normal gait.  Her motor strength is good in the upper and lower extremities.  Her sensation is intact in the upper and lower extremities.  IMPRESSION: 1. Cervicalgia. 2. Chronic low back pain. 3. History of multiple sclerosis. 4. History of lupus.  PLAN:  Refill her Percocet 5/325 one p.o. t.i.d. #90 with no refill. She will follow up in my clinic in 1 month and with Dr.  Pamelia Hoit in 2 months.  Her questions were encouraged and answered.     Symphonie Schneiderman L. Blima Dessert Electronically Signed    RLW/MedQ D:  03/25/2011 09:54:24  T:  03/25/2011 10:45:51  Job #:  [id number]

## 2011-04-20 ENCOUNTER — Encounter (INDEPENDENT_AMBULATORY_CARE_PROVIDER_SITE_OTHER): Payer: Medicare Other | Admitting: Psychiatry

## 2011-04-20 DIAGNOSIS — F3289 Other specified depressive episodes: Secondary | ICD-10-CM

## 2011-04-20 DIAGNOSIS — F329 Major depressive disorder, single episode, unspecified: Secondary | ICD-10-CM

## 2011-04-22 ENCOUNTER — Encounter: Payer: Medicare Other | Attending: Physical Medicine and Rehabilitation | Admitting: Neurosurgery

## 2011-04-22 DIAGNOSIS — M5430 Sciatica, unspecified side: Secondary | ICD-10-CM

## 2011-04-22 DIAGNOSIS — M545 Low back pain, unspecified: Secondary | ICD-10-CM | POA: Insufficient documentation

## 2011-04-22 DIAGNOSIS — M3320 Polymyositis, organ involvement unspecified: Secondary | ICD-10-CM | POA: Insufficient documentation

## 2011-04-22 DIAGNOSIS — G8929 Other chronic pain: Secondary | ICD-10-CM | POA: Insufficient documentation

## 2011-04-22 DIAGNOSIS — M51379 Other intervertebral disc degeneration, lumbosacral region without mention of lumbar back pain or lower extremity pain: Secondary | ICD-10-CM | POA: Insufficient documentation

## 2011-04-22 DIAGNOSIS — M542 Cervicalgia: Secondary | ICD-10-CM

## 2011-04-22 DIAGNOSIS — G35 Multiple sclerosis: Secondary | ICD-10-CM | POA: Insufficient documentation

## 2011-04-22 DIAGNOSIS — M5137 Other intervertebral disc degeneration, lumbosacral region: Secondary | ICD-10-CM | POA: Insufficient documentation

## 2011-04-22 DIAGNOSIS — M329 Systemic lupus erythematosus, unspecified: Secondary | ICD-10-CM | POA: Insufficient documentation

## 2011-04-22 NOTE — Assessment & Plan Note (Signed)
Jackie Goodman is a patient of Dr. Pamelia Hoit, is seen for chronic cervicalgia, chronic low back pain, and history of MS.  The patient states that she has no change in her pain level.  She does have a recent history of an eyelid infection that was lanced and she has received antibiotics for that.  Now she has a jaw/tooth infection and she is going to go to an Urgent Care Clinic to try to treat it.  Today, her average pain is an 8.  General activity level is 8.  Pain is worse in the morning, daytime, and evening.  Pain is worse with walking, bending, standing, and most activities.  Rest, heat, and medications tend to help.  She walks without assistance.  She does climb steps and drives. She is on disability.  REVIEW OF SYSTEMS:  Notable for those difficulties described above as well as some weakness.  Oswestry score is 60.  PAST MEDICAL HISTORY:  Unchanged.  SOCIAL HISTORY:  She is single.  Lives with her sister.  FAMILY HISTORY:  Unchanged.  PHYSICAL EXAMINATION:  Blood pressure is 135/95, pulse 72, respirations 18, O2 sat was unobtainable because of her nails.  Constitutionally, she is within normal limits.  She is alert and oriented x3.  Her reflexes are 2+ in the upper and lower extremities.  Sensation appears to be intact.  Her strength is good in the upper and lower extremities.  ASSESSMENT: 1. Cervicalgia. 2. History of chronic low back pain. 3. History of multiple sclerosis.  PLAN:  Today, she just needs a refill on Percocet 5/325 one p.o. t.i.d. #90 with no refill.  She will follow up with Dr. Pamelia Hoit in 1 month. Her questions were otherwise encouraged and answered.     [name] L. Blima Dessert Electronically Signed    RLW/MedQ D:  04/22/2011 10:15:00  T:  04/22/2011 10:25:38  Job #:  [id number]

## 2011-05-20 ENCOUNTER — Ambulatory Visit: Payer: Medicare Other | Admitting: Physical Medicine and Rehabilitation

## 2011-05-25 ENCOUNTER — Encounter
Payer: Medicare Other | Attending: Physical Medicine and Rehabilitation | Admitting: Physical Medicine and Rehabilitation

## 2011-05-25 DIAGNOSIS — M25559 Pain in unspecified hip: Secondary | ICD-10-CM

## 2011-05-25 DIAGNOSIS — M79609 Pain in unspecified limb: Secondary | ICD-10-CM

## 2011-05-25 DIAGNOSIS — R209 Unspecified disturbances of skin sensation: Secondary | ICD-10-CM

## 2011-05-25 DIAGNOSIS — R1031 Right lower quadrant pain: Secondary | ICD-10-CM | POA: Insufficient documentation

## 2011-05-25 DIAGNOSIS — M5380 Other specified dorsopathies, site unspecified: Secondary | ICD-10-CM | POA: Insufficient documentation

## 2011-05-25 DIAGNOSIS — M545 Low back pain, unspecified: Secondary | ICD-10-CM

## 2011-05-25 NOTE — Assessment & Plan Note (Signed)
HISTORY OF PRESENT ILLNESS:  Jackie Goodman is a 55 year old woman who has been followed at [facility] for chronic pain complaints related to her low back and more recently her right leg.  She has multiple specialists who she follows up with as well at [facility] in Neurology, Rheumatology as well as Nephrology.  She has a history of multiple sclerosis polymyositis and systemic lupus erythematosus.  She is back in today and states that she has been having some increasing pain in her low back, in her right leg radiating from the back around to the right thigh, somewhat down the leg into the right calf on occasion. Pain is worse when she is up walking.  She reports some numbness in the right leg at times as well but not persistent and last MRI was done December 05, 2010 of the lumbar spine which is attached to the chart. Briefly, she does have some moderate-to-severe facet hypertrophy at L2- L3 and L3-L4.  She has some degenerative disk disease at L4-L5.  She also complains that she has had some problems with pain on the right side of her face.  She has had some recent infections.  She was seen in Urgent Care and had some Keflex for this.  Average pain is about 8 on a scale of 10.  She can walk about 20 minutes at a time.  She is independent with self-care.  She is able to climb stairs and drive.  She reports some weakness.  Denies problems with bowel or bladder. Denies depression, anxiety, or suicidal ideation.  There are no other changes in past medical, social, or family history other than recent tooth infection, jaw infection as indicated in last [facility].  PHYSICAL EXAMINATION:  VITAL SIGNS:  Today, her blood pressure is 132/78, pulse 78, respirations 14. GENERAL:  She is a well-developed, well-nourished [place] American woman who does not appear in any distress.  She is oriented x3. NEUROLOGIC:  Speech is clear.   Affect is bright.  She is alert, cooperative, and pleasant.  Follows commands without difficulty. Answers my questions appropriately.  Cranial nerves to coordination are grossly intact.  Reflexes are 2+ in patellar and Achilles tendon.  No abnormal tone, clonus, or tremors are noted.  She has good motor strength in both lower extremities without focal deficits.  Straight leg raise is negative.  She has decreased sensation over the right thigh with light touch.  She transitions easily from sitting to standing.  Her gait is slightly asymmetric but not particularly antalgic.  She reports no significant discomfort with forward flexion of the lumbar spine but does report pain with extension.  As she is sitting, internal and external rotation of both hips are evaluated.  She has diminished external rotation on the right and she complains of some groin pain with rotation.  LABORATORY DATA:  Recent urine drug screen which was carried out on February 24, 2011 was positive for metabolites of cocaine.  IMPRESSION: 1. Positive urine drug screen for metabolites of cocaine. 2. Chronic low back pain with known facet arthropathy. 3. Right intermittent lower extremity pain/groin pain, cannot rule out     right hip involvement.  PLAN: 1. Referral to [facility] for substance abuse management. 2. X-ray of bilateral hips, AP and frog views. 3. Discussion with the patient regarding nonnarcotic management.  She     has had a violation of her narcotic agreement and currently  undergoing workup for her hip pain at this time.  I am also     considering discharge from the clinic for this violation, however,     we will see her back next month to review radiographs with her and     offer possible referral for further management prior to discharge.     She is upset at this time.  She states that she does not use any     drugs of abuse, however, she did allow [place] to get her set up for a     referral to the  [facility].     Brantley Stage, M.D. Electronically Signed    DMK/MedQ D:  05/25/2011 13:17:11  T:  05/25/2011 16:24:46  Job #:  [id number]

## 2011-05-29 LAB — URINALYSIS, ROUTINE W REFLEX MICROSCOPIC
Bilirubin Urine: NEGATIVE
Glucose, UA: NEGATIVE
Hgb urine dipstick: NEGATIVE
Ketones, ur: NEGATIVE
Nitrite: NEGATIVE
Protein, ur: NEGATIVE
Specific Gravity, Urine: 1.014
Urobilinogen, UA: 0.2
pH: 6

## 2011-05-29 LAB — COMPREHENSIVE METABOLIC PANEL
ALT: 11
AST: 21
Albumin: 3.6
Alkaline Phosphatase: 50
BUN: 10
CO2: 28
Calcium: 9.9
Chloride: 107
Creatinine, Ser: 1.07
GFR calc Af Amer: >60
GFR calc non Af Amer: 54 — ABNORMAL LOW
Glucose, Bld: 81
Potassium: 4
Sodium: 141
Total Bilirubin: 0.7
Total Protein: 7.5

## 2011-05-29 LAB — CBC
HCT: 38.4
Hemoglobin: 12.6
MCHC: 32.9
MCV: 96.7
Platelets: 178
RBC: 3.96
RDW: 12.7
WBC: 5.6

## 2011-05-29 LAB — DIFFERENTIAL
Basophils Absolute: 0
Basophils Relative: 1
Eosinophils Absolute: 0.1
Eosinophils Relative: 2
Lymphocytes Relative: 53 — ABNORMAL HIGH
Lymphs Abs: 3
Monocytes Absolute: 0.3
Monocytes Relative: 6
Neutro Abs: 2.1
Neutrophils Relative %: 38 — ABNORMAL LOW

## 2011-06-01 LAB — URINE MICROSCOPIC-ADD ON

## 2011-06-01 LAB — COMPREHENSIVE METABOLIC PANEL
ALT: 16
AST: 30
Albumin: 4.2
CO2: 27
Calcium: 10.3
GFR calc Af Amer: 50 — ABNORMAL LOW
GFR calc non Af Amer: 41 — ABNORMAL LOW
Sodium: 142
Total Protein: 8.2

## 2011-06-01 LAB — URINALYSIS, ROUTINE W REFLEX MICROSCOPIC
Bilirubin Urine: NEGATIVE
Nitrite: NEGATIVE
Specific Gravity, Urine: 1.019
pH: 7

## 2011-06-01 LAB — DIFFERENTIAL
Eosinophils Absolute: 0
Eosinophils Relative: 0
Lymphs Abs: 0.8
Monocytes Absolute: 0.4
Monocytes Relative: 3

## 2011-06-01 LAB — CBC
MCHC: 32.8
RBC: 4.27

## 2011-06-15 ENCOUNTER — Encounter (INDEPENDENT_AMBULATORY_CARE_PROVIDER_SITE_OTHER): Payer: Medicare Other | Admitting: Psychiatry

## 2011-06-15 DIAGNOSIS — F329 Major depressive disorder, single episode, unspecified: Secondary | ICD-10-CM

## 2011-06-22 ENCOUNTER — Encounter
Payer: Medicare Other | Attending: Physical Medicine and Rehabilitation | Admitting: Physical Medicine and Rehabilitation

## 2011-06-30 ENCOUNTER — Encounter: Payer: Medicare Other | Admitting: Neurosurgery

## 2011-08-12 ENCOUNTER — Encounter (HOSPITAL_COMMUNITY): Payer: Medicare Other | Admitting: Psychiatry

## 2011-09-08 ENCOUNTER — Other Ambulatory Visit (HOSPITAL_COMMUNITY): Payer: Self-pay | Admitting: Psychiatry

## 2011-09-08 DIAGNOSIS — F329 Major depressive disorder, single episode, unspecified: Secondary | ICD-10-CM

## 2011-09-11 DIAGNOSIS — M545 Low back pain, unspecified: Secondary | ICD-10-CM | POA: Diagnosis not present

## 2011-09-11 DIAGNOSIS — Z5181 Encounter for therapeutic drug level monitoring: Secondary | ICD-10-CM | POA: Diagnosis not present

## 2011-09-11 DIAGNOSIS — M47817 Spondylosis without myelopathy or radiculopathy, lumbosacral region: Secondary | ICD-10-CM | POA: Diagnosis not present

## 2011-09-11 DIAGNOSIS — M5137 Other intervertebral disc degeneration, lumbosacral region: Secondary | ICD-10-CM | POA: Diagnosis not present

## 2011-09-23 ENCOUNTER — Ambulatory Visit (INDEPENDENT_AMBULATORY_CARE_PROVIDER_SITE_OTHER): Payer: Medicare Other | Admitting: Psychiatry

## 2011-09-23 DIAGNOSIS — F329 Major depressive disorder, single episode, unspecified: Secondary | ICD-10-CM

## 2011-09-23 MED ORDER — SERTRALINE HCL 50 MG PO TABS: 50.0000 mg | ORAL_TABLET | Freq: Every day | ORAL | Status: DC

## 2011-09-23 MED ORDER — ALPRAZOLAM 1 MG PO TABS: 1.0000 mg | ORAL_TABLET | Freq: Two times a day (BID) | ORAL | Status: DC

## 2011-09-23 MED ORDER — BUPROPION HCL ER (XL) 150 MG PO TB24: 150.0000 mg | ORAL_TABLET | Freq: Every day | ORAL | Status: DC

## 2011-09-23 NOTE — Progress Notes (Signed)
Patient came for her followup appointment. She is compliant with her Xanax however admitted not taking Zoloft and Wellbutrin on a regular basis. Patient when she takes the medication she fully dizzy and nauseated. She mentioned that she has cut down her medication for almost 4 months however she never mentioned to [place] before. Recently she is in the distress as her father now admitted to nursing home. His kidneys are also failing. Patient is concerned about her dad but also fair that this is a chronic illness and nothing she can do about it. Overall patient is is stable on her medication Xanax however I mentioned if she is not taking full dose of Zoloft and Wellbutrin then she should take it smaller dose but everyday. She never abuses her Xanax or uses alcohol or other drugs. She denies any agitation anger or mood swing.  Mental status examination She is pleasant cooperative and maintained good eye contact. She is well dressed and well groomed. Her speech is soft clear and coherent. Her thought process is logical linear and goal-directed. She denies any active or passive suicidal thoughts or homicidal thoughts. There no psychotic symptoms present. She's alert and oriented x3. Her insight judgment impulse control is okay.  Diagnosis Maj. depressive disorder  Plan At this time I will reduce her Zoloft and Wellbutrin since patient is not taking full dose. I recommended to try Zoloft 50 mg daily and Wellbutrin XL 150 mg daily however at some point I like to take her off from Zoloft and continued on one medication. She will continue Xanax 1 mg twice a day for severe anxiety and panic attack. I also talk about relapse of her illness due to poorly compliant with medication. I recommended to call us if she is a question or concern about medication. I will see her again in 6-8 weeks.

## 2011-10-19 DIAGNOSIS — M719 Bursopathy, unspecified: Secondary | ICD-10-CM | POA: Diagnosis not present

## 2011-10-19 DIAGNOSIS — M67919 Unspecified disorder of synovium and tendon, unspecified shoulder: Secondary | ICD-10-CM | POA: Diagnosis not present

## 2011-10-19 DIAGNOSIS — M545 Low back pain, unspecified: Secondary | ICD-10-CM | POA: Diagnosis not present

## 2011-10-19 DIAGNOSIS — M5137 Other intervertebral disc degeneration, lumbosacral region: Secondary | ICD-10-CM | POA: Diagnosis not present

## 2011-10-21 ENCOUNTER — Encounter (HOSPITAL_COMMUNITY): Payer: Self-pay

## 2011-11-23 ENCOUNTER — Ambulatory Visit (HOSPITAL_COMMUNITY): Payer: Medicare Other | Admitting: Psychiatry

## 2011-11-23 ENCOUNTER — Ambulatory Visit (INDEPENDENT_AMBULATORY_CARE_PROVIDER_SITE_OTHER): Payer: Medicare Other | Admitting: Psychiatry

## 2011-11-23 ENCOUNTER — Encounter (HOSPITAL_COMMUNITY): Payer: Self-pay | Admitting: Psychiatry

## 2011-11-23 VITALS — BP 138/96 | HR 70 | Ht 65.0 in | Wt 155.0 lb

## 2011-11-23 DIAGNOSIS — F329 Major depressive disorder, single episode, unspecified: Secondary | ICD-10-CM

## 2011-11-23 DIAGNOSIS — Z79899 Other long term (current) drug therapy: Secondary | ICD-10-CM

## 2011-11-23 MED ORDER — SERTRALINE HCL 50 MG PO TABS: 50.0000 mg | ORAL_TABLET | Freq: Every day | ORAL | Status: DC

## 2011-11-23 MED ORDER — ALPRAZOLAM 1 MG PO TABS: 1.0000 mg | ORAL_TABLET | Freq: Two times a day (BID) | ORAL | Status: DC

## 2011-11-23 MED ORDER — BUPROPION HCL ER (XL) 150 MG PO TB24: 150.0000 mg | ORAL_TABLET | Freq: Every day | ORAL | Status: DC

## 2011-11-23 NOTE — Progress Notes (Signed)
Chief complaint My father died 2 months ago and I'm very sad and depressed .  History presenting illness Patient is a 56 year old African American divorced unemployed female who came for her followup appointment.  She has been seen in this office since 2009 for her depression and medication management.  Patient came for her appointment and reported her dad died 2 months ago after a prolonged illness.  He was sent to nursing home but he died 3 weeks later as his kidney stopped working will .  Patient has been going through the grief after the death of her father.  She endorse poor sleep increased anxiety crying spells and some social isolation however she denies any active or passive suicidal thinking.  She is not seeing any therapist for grief counselor but this time she is very interested to resume her therapy.  In the past we have scheduled appointment with therapist but she did not come for the appointment at this time patient feels she need a therapist on a regular basis.  She denies any agitation anger or mood swings.  She had a good family support system.  She is taking her psychiatric medication regularly and feel they are helping.  She denies any side effects of medication.  Current psychiatric medication Sertraline 50 mg daily Wellbutrin XL 150 mg daily Xanax 1 mg twice a day.  Alcohol and substance use history She denies any history of alcohol or substance use in recent months however reported history of using cocaine drinking alcohol in 1993 .  Past psychiatric history Patient has history of depression since 1993 status post diagnosed with lupus and myositis .  She denies any history of inpatient psychiatric treatment or any suicidal attempt.  Family history Patient endorsed one of his brother has been receiving treatment in [place] [facility] however she do not know the details.  Psychosocial history Patient has been married once however her marriage last for only 24 months.  She  has 2 children to .  She lives with her twin sister.  She has 4 other siblings.  Her mother died due to cancer and recently her father died due to complications and stopping kidney function.    Medical history Lupus, chronic renal insufficiency, myositis, chronic pain .  She see Dr. for her lupus in [place].  Her next appointment is in June.  She does not remember complete list of medication.  Mental status examination Patient is casually dressed and fairly groomed.  She appears tired and easily tearful .  Her speech is soft but clear and coherent.  Her thought process is logical linear and goal-directed.  She described her mood is sad and tearful and her affect is constricted.  She denies any active or passive suicidal thinking and homicidal thinking.  She denies any auditory or visual hallucination.  There no psychotic symptoms present at this time.  She's alert and oriented x3 her attention and concentration is fair.  Her insight judgment and impulse control is okay.  Assessment Axis I depressive disorder NOS , mood disorder due to general medical condition Axis II deferred Axis III see medical history Axis IV mild to moderate Axis V 55-60  Plan Talk to the patient and reassurance given.  Patient is now more compliant with her antidepressant .  However I do believe patient needs to restart therapy and grief counseling. Patient agreed to the plan and I will schedule appointment with a therapist in this office.  I would also get routine blood work  including CBC, CMP and hemoglobin A1c .  I recommend to bring list of medication on her next appointment to reconcile.  I recommended to call us if she feels worsening of her symptoms or any time having suicidal thinking and homicidal thinking.   I will see her again in 4 weeks.   Time spent 30 minutes.

## 2011-12-09 ENCOUNTER — Ambulatory Visit (HOSPITAL_COMMUNITY): Payer: Medicare Other | Admitting: Psychiatry

## 2011-12-17 DIAGNOSIS — M329 Systemic lupus erythematosus, unspecified: Secondary | ICD-10-CM | POA: Diagnosis not present

## 2011-12-17 DIAGNOSIS — I10 Essential (primary) hypertension: Secondary | ICD-10-CM | POA: Diagnosis not present

## 2011-12-17 DIAGNOSIS — N183 Chronic kidney disease, stage 3 unspecified: Secondary | ICD-10-CM | POA: Diagnosis not present

## 2011-12-18 ENCOUNTER — Ambulatory Visit (INDEPENDENT_AMBULATORY_CARE_PROVIDER_SITE_OTHER): Payer: Medicare Other | Admitting: Psychology

## 2011-12-18 DIAGNOSIS — Z634 Disappearance and death of family member: Secondary | ICD-10-CM | POA: Insufficient documentation

## 2011-12-18 NOTE — Progress Notes (Signed)
   THERAPIST PROGRESS NOTE  Session Time: 1445 - 1545  Participation Level: Active  Behavioral Response: Well GroomedAlertDysphoric  Type of Therapy: Individual Therapy  Treatment Goals addressed: Coping  Interventions: Psychosocial Skills: returning to functioning after death of father and being a caregiver, Supportive and Other: finding "the new normal"  Summary: Jackie Goodman is a 56 y.o. female who presents with grief over the recent death of her father, age 26, on Oct 18, 2011.  For the six months prior, she had been travelling from [place] to [place] nearly daily to see him.  She took over as power of attorney, decided to place him in a nursing home and then in hospice all with some push back from his step-children, but with support of her siblings.  He died of complications of diabetes and end stage renal disease after repeated bouts of pneumonia and the decision to stop the dialysis that had become ineffective.  Jackie Goodman is one of his nine children from his first marriage, all of whom had stayed in close touch with him over the years. There had been some conflict with his step-children over the past few years as he continued to live with one of them after his wife died and he paid a lot of her bills.  Jackie Goodman has sought to maintain a cordial relationship with them during his illness, as "he always wanted [place] to get along".  She expresses some bitterness about them taking advantage of him and stealing some of his medications, but she chooses not to bring those things up with them now.  There is no significant inheritance beyond money for his final expenses, so she does not anticipate future conflict about that.  Jackie Goodman has found herself being more isolative, not getting out to have her hair and nails done, avoiding the usual activities with her sisters, crying and thinking about him every day and starting to call him on the phone.  We talked about the parameters of normal grief and the  possibility of other events such as thinking she saw or heard him in familiar places.  Also shared the normality of feeling guilt even when it seemed unreasonable to do so.  Over the past week, "things have begun to get better".  She did get to the beauty parlor and has plans to have her nails done.  She is finding motivation to finalize plans for a headstone for his grave and wants it in place before father's day.  She has missed several monthly "girls weekends" with her sisters and so is planning for the next trip to be to [facility] for Mother's Day to visit her son.   We also talked about her own health and her worry about having a current flare-up of lupus/MS/polymyositis.  She receives care from a rheumatologist at [facility] and has been to see him this past week.   Suicidal/Homicidal: Nowithout intent/plan  Therapist Response: see above   Treatment planning:  Told her about my retirement plans for July, 2013.  We plan to meet every two weeks until that time or for as long as she feels the need for the support of therapy.  Plan: Return again in 2 weeks.  Diagnosis: Axis I: Bereavement,   Major depression by history    Axis II: No diagnosis    Jackie Derasmo, Jackie Goodman 12/18/2011

## 2011-12-23 ENCOUNTER — Encounter (HOSPITAL_COMMUNITY): Payer: Self-pay | Admitting: Psychiatry

## 2011-12-23 ENCOUNTER — Ambulatory Visit (INDEPENDENT_AMBULATORY_CARE_PROVIDER_SITE_OTHER): Payer: Medicare Other | Admitting: Psychiatry

## 2011-12-23 DIAGNOSIS — F329 Major depressive disorder, single episode, unspecified: Secondary | ICD-10-CM

## 2011-12-23 NOTE — Progress Notes (Signed)
Chief complaint Medication management followup.    History presenting illness Patient is a 56 year old African American divorced unemployed female who came for her followup appointment.  On her last visit she mentioned that her father died almost 2 months ago.  She continues to have depressive thoughts and going through grief.  She start seeing therapist and is scheduled to see her in a few weeks.  She endorse social isolation and crying spells however she feels her current psychiatric medication working very well.  She denies any insomnia or any agitation.  She recently visited her kidney doctor and liver Dr. in [place].  She has blood work however we have not received any results so far.  She does not recall any new medication added.  She is tolerating her psychiatric medication and denies any side effects.  She denies any agitation anger or mood swings.  She has a good family support.  She talks to her sister on a regular basis.  Current psychiatric medication Sertraline 50 mg daily Wellbutrin XL 150 mg daily Xanax 1 mg twice a day.  Alcohol and substance use history She denies any history of alcohol or substance use in recent months however reported history of using cocaine drinking alcohol in 1993 .  Past psychiatric history Patient has history of depression since 1993 status post diagnosed with lupus and myositis .  She denies any history of inpatient psychiatric treatment or any suicidal attempt.  Family history Patient endorsed one of his brother has been receiving treatment in [place] [facility] however she do not know the details.  Psychosocial history Patient has been married once however her marriage last for only 24 months.  She has 2 children to .  She lives with her twin sister.  She has 4 other siblings.  Her mother died due to cancer and recently her father died due to complications and stopping kidney function.    Medical history Lupus, chronic renal insufficiency, myositis,  chronic pain .  She see Dr. for her lupus in [place].  Her next appointment is in June.  She does not remember complete list of medication.  Mental status examination Patient is casually dressed and fairly groomed.  She is calm pleasant and cooperative.  At times she is tearful when she is talking about her deceased father.  Her speech is soft but clear and coherent.  Her thought process is logical linear and goal-directed.  She described her mood is sad and tearful and her affect is constricted.  She denies any active or passive suicidal thinking and homicidal thinking.  She denies any auditory or visual hallucination.  There no psychotic symptoms present at this time.  She's alert and oriented x3 her attention and concentration is fair.  Her insight judgment and impulse control is okay.  Assessment Axis I depressive disorder NOS , mood disorder due to general medical condition Axis II deferred Axis III see medical history Axis IV mild to moderate Axis V 55-60  Plan I will continue her current medication.  I recommend to have blood work sent to [place] which was drawn by her liver Dr.  I explained risks and benefits of medication and recommended to see therapist regularly for coping and social skills.  I will see her again in 4 weeks.

## 2012-01-08 ENCOUNTER — Ambulatory Visit (HOSPITAL_COMMUNITY): Payer: Self-pay | Admitting: Psychology

## 2012-01-15 ENCOUNTER — Ambulatory Visit (INDEPENDENT_AMBULATORY_CARE_PROVIDER_SITE_OTHER): Payer: Medicare Other | Admitting: Psychology

## 2012-01-15 DIAGNOSIS — Z7189 Other specified counseling: Secondary | ICD-10-CM

## 2012-01-15 DIAGNOSIS — Z719 Counseling, unspecified: Secondary | ICD-10-CM

## 2012-01-15 NOTE — Progress Notes (Signed)
   THERAPIST PROGRESS NOTE  Session Time: 0930 - 1030  Participation Level: Active  Behavioral Response: Casual and NeatAlertDysphoric  Type of Therapy: Individual Therapy  Treatment Goals addressed: Coping  Interventions: Psychosocial Skills: dealing with grief around holiday/anniversary times; putting some structure into life and Reframing  Summary: Jackie Goodman is a 56 y.o. female who presents with "good days and bad days" regarding the grief over the death of her father.  Bad days involve more dwelling on his death and doubts about whether she did all she could to help him and tearfulness.  On good days, she can remember him and feel grateful for the time she had with him at the end of his life.  She regrets that they did not get to take him to a baseball game or the zoo.  Affect is blunted, but with moments of smiles when she talks about the positive love for her grandson and grand-dog. Over all, "I'm doing okay."  Other than leaving the house for Dr. Algie Coffer, she has been to a baseball game and is going again tonight with her grandson.  She has not yet had her nails done, and this is very uncharacteristic for her to let it go so long that she herself peeled off the nails.   Suicidal/Homicidal: Nowithout intent/plan  Therapist Response: We talked about making some plans for Father's Day, brainstorming activity that would have meaning for her and family.  Also looked at setting some time limits to her own goals: nails will be done prior to [facility], May 27. Identified her worry about her own health and desire to never have to be on dialysis herself.  Besides her father, she has an aunt, his sister who has major complications from diabetes.  She keeps up with her by phone weekly. We explored the reason for some of the complicated feelings her brothers had toward their father because of his absence when they were growing up.  It was mainly after the death of their mother that the  siblings became close to him.  Plan: Return again in 2 weeks.  She will continue to work on planning her schedule to stay active with things she enjoys doing.  Primary goal is dealing with the bereavement issues.  Diagnosis: Axis I: Bereavement and Major Depression, Recurrent     Axis II: No diagnosis    Mindie Rawdon, RN 01/15/2012

## 2012-01-22 ENCOUNTER — Ambulatory Visit (INDEPENDENT_AMBULATORY_CARE_PROVIDER_SITE_OTHER): Payer: Medicare Other | Admitting: Psychiatry

## 2012-01-22 ENCOUNTER — Encounter (HOSPITAL_COMMUNITY): Payer: Self-pay | Admitting: Psychiatry

## 2012-01-22 DIAGNOSIS — F0630 Mood disorder due to known physiological condition, unspecified: Secondary | ICD-10-CM

## 2012-01-22 DIAGNOSIS — F329 Major depressive disorder, single episode, unspecified: Secondary | ICD-10-CM

## 2012-01-22 MED ORDER — SERTRALINE HCL 50 MG PO TABS: 50.0000 mg | ORAL_TABLET | Freq: Every day | ORAL | Status: DC

## 2012-01-22 MED ORDER — BUPROPION HCL ER (XL) 150 MG PO TB24: 150.0000 mg | ORAL_TABLET | Freq: Every day | ORAL | Status: DC

## 2012-01-22 MED ORDER — ALPRAZOLAM 1 MG PO TABS: 1.0000 mg | ORAL_TABLET | Freq: Two times a day (BID) | ORAL | Status: DC

## 2012-01-22 NOTE — Progress Notes (Signed)
Chief complaint Medication management followup.    History presenting illness Patient is a 57 year old African American divorced unemployed female who came for her followup appointment.  She is been compliant with her medication.  She denies any side effects of medication.  She still going through some grief about her father who died 4 months ago.  However she has a good family support.  She has recently seen her doctor in [place].  She was told that her creatinine is 1.4 which he has been for a long time.  Her calcium was little high and she was referred for further testing to evaluate her parathyroid gland.  However we do not have complete blood test results.  She is trying to contact her primary care physician for further test.  Overall her depression anxiety is controlled on her psychiatric medication.  She denies any agitation anger mood swing.  She denies any crying spells.  She is seeing therapist in this office for coping and social skills and feel therapy is going very well.  She's not drinking or using any illegal substance.  Current psychiatric medication Sertraline 50 mg daily Wellbutrin XL 150 mg daily Xanax 1 mg twice a day.  Alcohol and substance use history She denies any history of alcohol or substance use in recent months however reported history of using cocaine drinking alcohol in 1993 .  Past psychiatric history Patient has history of depression since 1993 status post diagnosed with lupus and myositis .  She denies any history of inpatient psychiatric treatment or any suicidal attempt.  Family history Patient endorsed one of his brother has been receiving treatment in [place] [facility] however she do not know the details.  Psychosocial history Patient has been married once however her marriage last for only 24 months.  She has 2 children to .  She lives with her twin sister.  She has 4 other siblings.  Her mother died due to cancer and recently her father died due to  complications and stopping kidney function.    Medical history Lupus, chronic renal insufficiency, myositis, chronic pain .  She see Dr. for her lupus in [place].  Her next appointment is in June.  She does not remember complete list of medication.  Mental status examination Patient is casually dressed and fairly groomed.  She is calm pleasant and cooperative.  She maintained fair eye contact.  Her speech is slow and soft but clear and coherent.  Her thought processes logical and goal-directed.  She denies any active or passive suicidal thoughts or homicidal thoughts and she denies any auditory or visual hallucination.  There were no flight of idea or loose association.  She's alert and oriented x3.  Her insight judgment and impulse control is okay.  Assessment Axis I depressive disorder NOS , mood disorder due to general medical condition Axis II deferred Axis III see medical history Axis IV mild to moderate Axis V 55-60  Plan I I recommend to have her fax her complete blood test results.  I will continue her current psychiatric medication.  I explained the risks and benefits of medication and recommended to call us if she feels worsening of her symptoms or if she has any issues with the medication.  I will see her again in 2 months.

## 2012-02-02 ENCOUNTER — Telehealth (HOSPITAL_COMMUNITY): Payer: Self-pay | Admitting: Psychology

## 2012-02-02 ENCOUNTER — Ambulatory Visit (HOSPITAL_COMMUNITY): Payer: Self-pay | Admitting: Psychology

## 2012-02-04 DIAGNOSIS — N058 Unspecified nephritic syndrome with other morphologic changes: Secondary | ICD-10-CM | POA: Diagnosis not present

## 2012-02-04 DIAGNOSIS — N052 Unspecified nephritic syndrome with diffuse membranous glomerulonephritis: Secondary | ICD-10-CM | POA: Diagnosis not present

## 2012-02-04 DIAGNOSIS — F329 Major depressive disorder, single episode, unspecified: Secondary | ICD-10-CM | POA: Diagnosis not present

## 2012-02-04 DIAGNOSIS — F4541 Pain disorder exclusively related to psychological factors: Secondary | ICD-10-CM | POA: Insufficient documentation

## 2012-02-04 DIAGNOSIS — M329 Systemic lupus erythematosus, unspecified: Secondary | ICD-10-CM | POA: Diagnosis not present

## 2012-02-04 DIAGNOSIS — F411 Generalized anxiety disorder: Secondary | ICD-10-CM | POA: Diagnosis not present

## 2012-02-05 NOTE — Telephone Encounter (Signed)
See phone notes.

## 2012-03-21 ENCOUNTER — Ambulatory Visit (HOSPITAL_COMMUNITY): Payer: Self-pay | Admitting: Psychiatry

## 2012-03-23 ENCOUNTER — Ambulatory Visit (INDEPENDENT_AMBULATORY_CARE_PROVIDER_SITE_OTHER): Payer: Medicare Other | Admitting: Psychiatry

## 2012-03-23 ENCOUNTER — Encounter (HOSPITAL_COMMUNITY): Payer: Self-pay | Admitting: Psychiatry

## 2012-03-23 DIAGNOSIS — F329 Major depressive disorder, single episode, unspecified: Secondary | ICD-10-CM

## 2012-03-23 MED ORDER — SERTRALINE HCL 50 MG PO TABS: 50.0000 mg | ORAL_TABLET | Freq: Every day | ORAL | Status: DC

## 2012-03-23 MED ORDER — ALPRAZOLAM 1 MG PO TABS: 1.0000 mg | ORAL_TABLET | Freq: Two times a day (BID) | ORAL | Status: DC

## 2012-03-23 MED ORDER — BUPROPION HCL ER (XL) 150 MG PO TB24: 150.0000 mg | ORAL_TABLET | Freq: Every day | ORAL | Status: DC

## 2012-03-23 NOTE — Progress Notes (Signed)
Chief complaint Medication management followup.    History presenting illness Patient is a 56 year old African American divorced unemployed female who came for her followup appointment.  She is been compliant with her medication.  She denies any side effects of medication.  She's excited as her grandson recently graduated.  Patient still has moments of crying spells when she misses her deceased father however these crying spells are less intense and less frequent.  She sleeping better.  Recently she feel shoulder pain and thinking to schedule appointment with her primary care physician.  She sleep fine.  She denies any agitation anger mood swing.  She denies any feeling of hopelessness or helplessness.  She likes her current psychiatric medication.  She is scheduled to see her doctor at [facility] for followup on her lupus.  She will get blood work .  She's not drinking or using any illegal substance.  She is not asking for early refills of Xanax.  Current psychiatric medication Sertraline 50 mg daily Wellbutrin XL 150 mg daily Xanax 1 mg twice a day.  Alcohol and substance use history She denies any history of alcohol or substance use in recent months however reported history of using cocaine drinking alcohol in 1993 .  Past psychiatric history Patient has history of depression since 1993 status post diagnosed with lupus and myositis .  She denies any history of inpatient psychiatric treatment or any suicidal attempt.  Family history Patient endorsed one of his brother has been receiving treatment in [place] [facility] however she do not know the details.  Psychosocial history Patient has been married once however her marriage last for only 24 months.  She has 2 children to .  She lives with her twin sister.  She has 4 other siblings.  Her mother died due to cancer and recently her father died due to complications and stopping kidney function.    Medical history Lupus, chronic renal  insufficiency, myositis, chronic pain .  She see Dr. for her lupus in [place].  Her next appointment is in June.  She does not remember complete list of medication.  Mental status examination Patient is casually dressed and fairly groomed.  She is calm pleasant and cooperative.  She maintained fair eye contact.  Her speech is slow and soft but clear and coherent.  Her thought processes logical and goal-directed.  She denies any active or passive suicidal thoughts or homicidal thoughts and she denies any auditory or visual hallucination.  There were no flight of idea or loose association.  She's alert and oriented x3.  Her insight judgment and impulse control is okay.  Assessment Axis I depressive disorder NOS , mood disorder due to general medical condition Axis II deferred Axis III see medical history Axis IV mild to moderate Axis V 55-60  Plan I will continue her current psychiatric medication.  Patient is stable on her current regime.  I recommend to have her blood test results faxed to [place].  I recommend to call us if she has any question or concern about the medication if he feel worsening of the symptoms.  I will see her again in 2 months.  Portion of this note is generated with voice dictation software and may contain typographical error.

## 2012-04-18 DIAGNOSIS — H259 Unspecified age-related cataract: Secondary | ICD-10-CM | POA: Diagnosis not present

## 2012-04-18 DIAGNOSIS — Z79899 Other long term (current) drug therapy: Secondary | ICD-10-CM | POA: Diagnosis not present

## 2012-04-28 DIAGNOSIS — I10 Essential (primary) hypertension: Secondary | ICD-10-CM | POA: Insufficient documentation

## 2012-05-06 DIAGNOSIS — L819 Disorder of pigmentation, unspecified: Secondary | ICD-10-CM | POA: Diagnosis not present

## 2012-05-06 DIAGNOSIS — L218 Other seborrheic dermatitis: Secondary | ICD-10-CM | POA: Diagnosis not present

## 2012-05-06 DIAGNOSIS — L738 Other specified follicular disorders: Secondary | ICD-10-CM | POA: Diagnosis not present

## 2012-05-06 DIAGNOSIS — L719 Rosacea, unspecified: Secondary | ICD-10-CM | POA: Diagnosis not present

## 2012-05-25 ENCOUNTER — Encounter (HOSPITAL_COMMUNITY): Payer: Self-pay | Admitting: Psychiatry

## 2012-05-25 ENCOUNTER — Ambulatory Visit (INDEPENDENT_AMBULATORY_CARE_PROVIDER_SITE_OTHER): Payer: Medicare Other | Admitting: Psychiatry

## 2012-05-25 VITALS — BP 109/79 | HR 89 | Wt 157.6 lb

## 2012-05-25 DIAGNOSIS — Z79899 Other long term (current) drug therapy: Secondary | ICD-10-CM

## 2012-05-25 DIAGNOSIS — F0630 Mood disorder due to known physiological condition, unspecified: Secondary | ICD-10-CM

## 2012-05-25 DIAGNOSIS — F329 Major depressive disorder, single episode, unspecified: Secondary | ICD-10-CM

## 2012-05-25 MED ORDER — ALPRAZOLAM 1 MG PO TABS: 1.0000 mg | ORAL_TABLET | Freq: Two times a day (BID) | ORAL | Status: DC

## 2012-05-25 MED ORDER — BUPROPION HCL ER (XL) 150 MG PO TB24: 150.0000 mg | ORAL_TABLET | Freq: Every day | ORAL | Status: DC

## 2012-05-25 MED ORDER — SERTRALINE HCL 50 MG PO TABS: 50.0000 mg | ORAL_TABLET | Freq: Every day | ORAL | Status: DC

## 2012-05-25 NOTE — Progress Notes (Signed)
Chief complaint Medication management followup.    History presenting illness Patient is a 56 year old African American divorced unemployed female who came for her followup appointment.  She is been compliant with her medication.  She denies any side effects of medication.  She visited her family recently and had a good time.  She continues to have some grief about the loss of his father but overall she is doing better.  She denies any recent crying spells.  Her family is very supportive.  Her brother has been sick recently who is diagnosed with melanoma .  She has some concern about her brother. She was given antibiotic recently for her yeast infection.  She scheduled to see her Dr. at [facility] for lupus on October 15.  She also scheduled to see her today Dr. next month.  It is unclear if she has any blood work.  Recently.  She denies any agitation anger mood swing.  She sleeping better.  Her appetite and weight is unchanged from the past.  She's not drinking or using any illegal drugs.  She's not asking for early refills of Xanax.  Current psychiatric medication Sertraline 50 mg daily Wellbutrin XL 150 mg daily Xanax 1 mg twice a day.  Alcohol and substance use history She denies any history of alcohol or substance use in recent months however reported history of using cocaine drinking alcohol in 1993 .  Past psychiatric history Patient has history of depression since 1993 status post diagnosed with lupus and myositis .  She denies any history of inpatient psychiatric treatment or any suicidal attempt.  Family history Patient endorsed one of his brother has been receiving treatment in [place] [facility] however she do not know the details.  Psychosocial history Patient has been married once however her marriage last for only 24 months.  She has 2 children to .  She lives with her twin sister.  She has 4 other siblings.  Her mother died due to cancer and recently her father died due to  complications and stopping kidney function.    Medical history Lupus, chronic renal insufficiency, myositis, headache, chronic pain .  She see Dr. for her lupus in [place].  Her PCP is [facility].   Mental status examination Patient is casually dressed and fairly groomed.  She is calm pleasant and cooperative.  She maintained fair eye contact.  Her speech is slow and soft but clear and coherent.  Her thought processes logical and goal-directed.  There were no flight of ideas or loose association.  Her fund of knowledge is adequate.  She denies any active or passive suicidal thoughts or homicidal thoughts and she denies any auditory or visual hallucination.  She's alert and oriented x3.  Her insight judgment and impulse control is okay.  Assessment Axis I depressive disorder NOS , mood disorder due to general medical condition Axis II deferred Axis III see medical history Axis IV mild to moderate Axis V 55-60  Plan I review last progress note, update her history and response to the medication.  I will continue her current psychiatric medication.  I will order CBC, CMP and hemoglobin A 1C which has not done or at least we do not have results.  I recommend to call us if she has any question or concern about the medication if he feel worsening of the symptoms.  Time spent 30 minutes. I will see her again in 2 months.  Portion of this note is generated with voice dictation software and  may contain typographical error.

## 2012-06-15 DIAGNOSIS — L819 Disorder of pigmentation, unspecified: Secondary | ICD-10-CM | POA: Diagnosis not present

## 2012-06-15 DIAGNOSIS — Q809 Congenital ichthyosis, unspecified: Secondary | ICD-10-CM | POA: Diagnosis not present

## 2012-07-25 ENCOUNTER — Ambulatory Visit (HOSPITAL_COMMUNITY): Payer: Self-pay | Admitting: Psychiatry

## 2012-07-27 ENCOUNTER — Ambulatory Visit (HOSPITAL_COMMUNITY): Payer: Self-pay | Admitting: Psychiatry

## 2012-08-04 ENCOUNTER — Ambulatory Visit (HOSPITAL_COMMUNITY): Payer: Self-pay | Admitting: Psychiatry

## 2012-08-08 ENCOUNTER — Ambulatory Visit (INDEPENDENT_AMBULATORY_CARE_PROVIDER_SITE_OTHER): Payer: Medicare Other | Admitting: Psychiatry

## 2012-08-08 ENCOUNTER — Encounter (HOSPITAL_COMMUNITY): Payer: Self-pay | Admitting: Psychiatry

## 2012-08-08 VITALS — BP 137/89 | HR 70 | Wt 163.5 lb

## 2012-08-08 DIAGNOSIS — F39 Unspecified mood [affective] disorder: Secondary | ICD-10-CM

## 2012-08-08 DIAGNOSIS — F329 Major depressive disorder, single episode, unspecified: Secondary | ICD-10-CM | POA: Diagnosis not present

## 2012-08-08 DIAGNOSIS — M329 Systemic lupus erythematosus, unspecified: Secondary | ICD-10-CM | POA: Insufficient documentation

## 2012-08-08 DIAGNOSIS — N189 Chronic kidney disease, unspecified: Secondary | ICD-10-CM | POA: Insufficient documentation

## 2012-08-08 DIAGNOSIS — G8929 Other chronic pain: Secondary | ICD-10-CM

## 2012-08-08 DIAGNOSIS — G35 Multiple sclerosis: Secondary | ICD-10-CM | POA: Insufficient documentation

## 2012-08-08 DIAGNOSIS — Z79899 Other long term (current) drug therapy: Secondary | ICD-10-CM

## 2012-08-08 DIAGNOSIS — L719 Rosacea, unspecified: Secondary | ICD-10-CM

## 2012-08-08 MED ORDER — ALPRAZOLAM 1 MG PO TABS: 1.0000 mg | ORAL_TABLET | Freq: Two times a day (BID) | ORAL | Status: DC

## 2012-08-08 MED ORDER — ALPRAZOLAM 1 MG PO TABS
1.0000 mg | ORAL_TABLET | Freq: Two times a day (BID) | ORAL | Status: DC
Start: 2012-08-08 — End: 2012-10-19

## 2012-08-08 MED ORDER — SERTRALINE HCL 50 MG PO TABS: 50.0000 mg | ORAL_TABLET | Freq: Every day | ORAL | Status: DC

## 2012-08-08 MED ORDER — BUPROPION HCL ER (XL) 150 MG PO TB24: 150.0000 mg | ORAL_TABLET | Freq: Every day | ORAL | Status: DC

## 2012-08-08 NOTE — Progress Notes (Signed)
Patient ID: Jackie Goodman, female   DOB: 1955-11-04, 56 y.o.   MRN: [id number] Chief complaint Medication management followup.    History presenting illness Patient is a 56 year old African American divorced unemployed female who came for her followup appointment.  She is been compliant with her medication.  She had a good Thanksgiving.  This was her first Thanksgiving since her father deceased.  She went the grave with other family members.  She was upset and sad but overall she handled well.  She's also believe that brother is getting treatment for her lymphoma . He is getting chemotherapy.  Patient is scheduled to see her doctor at [facility] but she missed appointment as she is in the process of moving .  She endorse recently more tired and diagnosed with Rosacea.  She is taking medication for that which is given by her dermatologist.  She likes to spend time with her family member and Christmastime.  She sleeping better.  She denies any recent agitation anger or any crying spells.  Her energy is decreased however her attention and concentration is okay.  She denies any feeling of hopelessness or helplessness.  She's not drinking or using any illegal substance.  She forgot to get blood work however promised to do in few days.  Current psychiatric medication Sertraline 50 mg daily Wellbutrin XL 150 mg daily Xanax 1 mg twice a day.  Alcohol and substance use history She denies any history of alcohol or substance use in recent months however reported history of using cocaine drinking alcohol in 1993 .  Past psychiatric history Patient has history of depression since 1993 status post diagnosed with lupus and myositis .  She denies any history of inpatient psychiatric treatment or any suicidal attempt.  Family history Patient endorsed one of his brother has been receiving treatment in [place] [facility] however she do not know the details.  Psychosocial history Patient has been married once however  her marriage last for only 24 months.  She has 2 children to .  She lives with her twin sister.  She has 4 other siblings.  Her mother died due to cancer and recently her father died due to complications and stopping kidney function.    Medical history Lupus, chronic renal insufficiency, myositis, headache, chronic pain .  She see Dr. Orson Aloe for her lupus in [place].  Her PCP is [facility].   Review of Systems  Constitutional: Positive for malaise/fatigue.  Musculoskeletal: Positive for myalgias, back pain and joint pain.  Skin: Positive for rash. Negative for itching.  Neurological: Positive for headaches. Negative for dizziness, tingling, seizures and loss of consciousness.  Psychiatric/Behavioral: Negative for depression, suicidal ideas, hallucinations, memory loss and substance abuse. The patient has insomnia. The patient is not nervous/anxious.    Filed Vitals:   08/08/12 1335  BP: 137/89  Pulse: 70  Weight 163lbs  Mental status examination Patient is casually dressed and fairly groomed.  She is pleasant and cooperative.  She maintained good eye contact.  Her speech is slow and soft but clear and coherent.  Her thought processes logical and goal-directed.  There were no flight of ideas or loose association.  Her fund of knowledge is adequate.  She denies any active or passive suicidal thoughts or homicidal thoughts and she denies any auditory or visual hallucination.  She's alert and oriented x3.  Her insight judgment and impulse control is okay.  Assessment Axis I depressive disorder NOS , mood disorder due to general medical condition Axis  II deferred Axis III  Patient Active Problem List  Diagnosis  . Depression  . Rosacea  . Lupus  . Multiple sclerosis  . Chronic pain  . Chronic renal insufficiency   Axis IV mild to moderate Axis V 55-60  Plan Her medication list updated, review her psychosocial stressors and response to the medication.  I encourage her to do blood  work.  A new prescription of blood work as given.  We will continue her current psychiatric medication.  Reassurance given.  Risks and benefits of medication explained.  I will see her again in 2 months.  Time spent 25 minutes  Portion of this note is generated with voice dictation software and may contain typographical error.

## 2012-08-18 DIAGNOSIS — N189 Chronic kidney disease, unspecified: Secondary | ICD-10-CM | POA: Diagnosis not present

## 2012-08-18 DIAGNOSIS — Z23 Encounter for immunization: Secondary | ICD-10-CM | POA: Diagnosis not present

## 2012-08-18 DIAGNOSIS — I129 Hypertensive chronic kidney disease with stage 1 through stage 4 chronic kidney disease, or unspecified chronic kidney disease: Secondary | ICD-10-CM | POA: Diagnosis not present

## 2012-08-18 DIAGNOSIS — I10 Essential (primary) hypertension: Secondary | ICD-10-CM | POA: Diagnosis not present

## 2012-10-10 ENCOUNTER — Ambulatory Visit (HOSPITAL_COMMUNITY): Payer: Self-pay | Admitting: Psychiatry

## 2012-10-19 ENCOUNTER — Encounter (HOSPITAL_COMMUNITY): Payer: Self-pay | Admitting: Psychiatry

## 2012-10-19 ENCOUNTER — Ambulatory Visit (INDEPENDENT_AMBULATORY_CARE_PROVIDER_SITE_OTHER): Payer: Medicare Other | Admitting: Psychiatry

## 2012-10-19 VITALS — BP 111/77 | HR 83 | Wt 163.0 lb

## 2012-10-19 DIAGNOSIS — F329 Major depressive disorder, single episode, unspecified: Secondary | ICD-10-CM | POA: Diagnosis not present

## 2012-10-19 DIAGNOSIS — Z79899 Other long term (current) drug therapy: Secondary | ICD-10-CM | POA: Diagnosis not present

## 2012-10-19 MED ORDER — BUPROPION HCL ER (XL) 150 MG PO TB24: 150.0000 mg | ORAL_TABLET | Freq: Every day | ORAL | Status: DC

## 2012-10-19 MED ORDER — ALPRAZOLAM 1 MG PO TABS
1.0000 mg | ORAL_TABLET | Freq: Two times a day (BID) | ORAL | Status: DC
Start: 2012-10-19 — End: 2013-01-17

## 2012-10-19 NOTE — Progress Notes (Signed)
Patient ID: Jackie Goodman, female   DOB: 11-24-1955, 57 y.o.   MRN: [id number]  Chief complaint Medication management followup.    History presenting illness Patient is a 57 year old African American divorced unemployed female who came for her followup appointment.  She's not taking Zoloft but compliant with Wellbutrin and Xanax.  She had a good Christmas.  On 36 she has anniversary of her father's death .  She handled very well.  Her brother is also getting treatment for his lymphoma.  Overall patient is doing very well on her medication.  She denies any agitation anger mood swing.  She sleeping better.  She had not seen her lupus specialist at [facility] due to moving but schedule again in March.  She moved to her new place in December and she liked it very much.  She sleeping better.  She denies any feeling of hopelessness or helplessness.  She denies any tremors or shakes.  She has blood work today however we have not received the results.  Current psychiatric medication Wellbutrin XL 150 mg daily Xanax 1 mg twice a day.  Alcohol and substance use history She denies any history of alcohol or substance use in recent months however reported history of using cocaine drinking alcohol in 1993 .  Past psychiatric history Patient has history of depression since 1993 status post diagnosed with lupus and myositis .  She denies any history of inpatient psychiatric treatment or any suicidal attempt.  Family history Patient endorsed one of his brother has been receiving treatment in [place] [facility] however she do not know the details.  Psychosocial history Patient has been married once however her marriage last for only 24 months.  She has 2 children to .  She lives with her twin sister.  She has 4 other siblings.  Her mother died due to cancer and recently her father died due to complications and stopping kidney function.    Medical history Lupus, chronic renal insufficiency, myositis, headache,  chronic pain .  She see Dr. Orson Aloe for her lupus in [place].  Her PCP is [facility].   Review of Systems  Constitutional: Positive for malaise/fatigue.  Musculoskeletal: Positive for myalgias, back pain and joint pain.  Skin: Positive for rash. Negative for itching.  Neurological: Positive for headaches. Negative for dizziness, tingling, seizures and loss of consciousness.  Psychiatric/Behavioral: Negative for depression, suicidal ideas, hallucinations, memory loss and substance abuse. The patient has insomnia. The patient is not nervous/anxious.    Filed Vitals:   10/19/12 1020  BP: 111/77  Pulse: 83  Weight 163lbs  Mental status examination Patient is casually dressed and fairly groomed.  She is pleasant and cooperative.  She maintained good eye contact.  Her speech is slow and soft but clear and coherent.  Her thought processes logical and goal-directed.  There were no flight of ideas or loose association.  Her fund of knowledge is adequate.  She denies any active or passive suicidal thoughts or homicidal thoughts and she denies any auditory or visual hallucination.  She's alert and oriented x3.  Her insight judgment and impulse control is okay.  Assessment Axis I depressive disorder NOS , mood disorder due to general medical condition Axis II deferred Axis III  Patient Active Problem List  Diagnosis  . Depression  . Rosacea  . Lupus  . Multiple sclerosis  . Chronic pain  . Chronic renal insufficiency   Axis IV mild to moderate Axis V 55-60  Plan I review her medication and  symptoms.  At this time she is not taking Zoloft and I will discontinue Zoloft.  Recommend to call us if she started to feel more depressed and anxious.  She will continue Wellbutrin and Xanax as prescribed.  Risk and benefit explain in detail.  Talked about benzodiazepine dependence tolerance and withdrawal symptoms.  Patient never ask for early he feels.  We will followup on her blood results.  I will see  her again in 3 months.  Portion of this note is generated with voice dictation software and may contain typographical error.

## 2013-01-17 ENCOUNTER — Encounter (HOSPITAL_COMMUNITY): Payer: Self-pay | Admitting: Psychiatry

## 2013-01-17 ENCOUNTER — Ambulatory Visit (INDEPENDENT_AMBULATORY_CARE_PROVIDER_SITE_OTHER): Payer: Medicare Other | Admitting: Psychiatry

## 2013-01-17 VITALS — BP 138/76 | HR 57 | Ht 65.0 in | Wt 165.8 lb

## 2013-01-17 DIAGNOSIS — F329 Major depressive disorder, single episode, unspecified: Secondary | ICD-10-CM | POA: Diagnosis not present

## 2013-01-17 DIAGNOSIS — F0630 Mood disorder due to known physiological condition, unspecified: Secondary | ICD-10-CM

## 2013-01-17 MED ORDER — ALPRAZOLAM 1 MG PO TABS: 1.0000 mg | ORAL_TABLET | Freq: Two times a day (BID) | ORAL | Status: DC

## 2013-01-17 MED ORDER — BUPROPION HCL ER (XL) 150 MG PO TB24: 150.0000 mg | ORAL_TABLET | Freq: Every day | ORAL | Status: DC

## 2013-01-17 NOTE — Progress Notes (Signed)
Patient ID: Jackie Goodman, female   DOB: 14-Oct-1955, 57 y.o.   MRN: [id number]  Chief complaint Medication management followup.    History presenting illness Patient is a 57 year old African American divorced unemployed female who came for her followup appointment.  She's compliant but Xanax and Wellbutrin.  She denies any recent agitation anger or mood swing.  Her appointment with her neurologist is be scheduled and she will see the rheumatologist on June 5.  She is also scheduled to her nephrologist in July.  She has blood work which was done in February.  Her creatinine is 1.44 and her hemoglobin A1c is 5.8.  Her liver enzymes and CBC is normal.  Patient denies any irritability anger or any aggression.  She is relieved that her brother is cancer free.  She's not drinking or using any illegal substance.  Current psychiatric medication Wellbutrin XL 150 mg daily Xanax 1 mg twice a day.  Alcohol and substance use history She denies any history of alcohol or substance use in recent months however reported history of using cocaine drinking alcohol in 1993 .  Past psychiatric history Patient has history of depression since 1993 status post diagnosed with lupus and myositis .  She denies any history of inpatient psychiatric treatment or any suicidal attempt.  Family history Patient endorsed one of his brother has been receiving treatment in [place] [facility] however she do not know the details.  Psychosocial history Patient has been married once however her marriage last for only 24 months.  She has 2 children to .  She lives with her twin sister.  She has 4 other siblings.  Her mother died due to cancer and recently her father died due to complications and stopping kidney function.    Medical history Lupus, chronic renal insufficiency, myositis, headache, chronic pain .  She see Dr. Orson Aloe for her lupus in [place].  Her PCP is [facility].   Review of Systems  Constitutional: Positive for  malaise/fatigue.  HENT: Negative.   Cardiovascular: Negative.   Musculoskeletal: Positive for myalgias, back pain and joint pain.  Skin: Positive for rash. Negative for itching.  Neurological: Negative.  Negative for dizziness, tingling, seizures and loss of consciousness.  Psychiatric/Behavioral: Negative for depression, suicidal ideas, hallucinations, memory loss and substance abuse. The patient is not nervous/anxious.    Filed Vitals:   01/17/13 1016  BP: 138/76  Pulse: 57  Weight 163lbs  Mental status examination Patient is casually dressed and fairly groomed.  She is pleasant and cooperative.  She maintained good eye contact.  Her speech is slow and soft but clear and coherent.  Her thought processes logical and goal-directed.  There were no flight of ideas or loose association.  Her fund of knowledge is adequate.  She denies any active or passive suicidal thoughts or homicidal thoughts and she denies any auditory or visual hallucination.  She's alert and oriented x3.  Her insight judgment and impulse control is okay.  Assessment Axis I depressive disorder NOS , mood disorder due to general medical condition Axis II deferred Axis III  Patient Active Problem List   Diagnosis Date Noted  . Rosacea 08/08/2012  . Lupus 08/08/2012  . Multiple sclerosis 08/08/2012  . Chronic pain 08/08/2012  . Chronic renal insufficiency 08/08/2012  . Depression 11/23/2011   Axis IV mild to moderate Axis V 55-60  Plan I reviewed her medication and recent blood work which was done in February.  Patient also scheduled to see her nephrologist in  July and will have another blood work.  I will continue Wellbutrin and Xanax at present dose.  Recommend to call us back if she is any question or concern if she feels worsening of the symptoms.  I will see him again in 3 months.  Portion of this note is generated with voice dictation software and may contain typographical error.

## 2013-02-02 DIAGNOSIS — M329 Systemic lupus erythematosus, unspecified: Secondary | ICD-10-CM | POA: Diagnosis not present

## 2013-02-02 DIAGNOSIS — Z5181 Encounter for therapeutic drug level monitoring: Secondary | ICD-10-CM | POA: Diagnosis not present

## 2013-02-02 DIAGNOSIS — Z79899 Other long term (current) drug therapy: Secondary | ICD-10-CM | POA: Diagnosis not present

## 2013-02-02 DIAGNOSIS — M25519 Pain in unspecified shoulder: Secondary | ICD-10-CM | POA: Diagnosis not present

## 2013-02-02 DIAGNOSIS — G43909 Migraine, unspecified, not intractable, without status migrainosus: Secondary | ICD-10-CM | POA: Insufficient documentation

## 2013-02-02 DIAGNOSIS — E559 Vitamin D deficiency, unspecified: Secondary | ICD-10-CM | POA: Diagnosis not present

## 2013-02-02 DIAGNOSIS — F329 Major depressive disorder, single episode, unspecified: Secondary | ICD-10-CM | POA: Diagnosis not present

## 2013-02-02 DIAGNOSIS — R809 Proteinuria, unspecified: Secondary | ICD-10-CM | POA: Diagnosis not present

## 2013-02-03 ENCOUNTER — Encounter: Payer: Self-pay | Admitting: Psychiatry

## 2013-02-20 DIAGNOSIS — Z79899 Other long term (current) drug therapy: Secondary | ICD-10-CM | POA: Diagnosis not present

## 2013-02-20 DIAGNOSIS — M329 Systemic lupus erythematosus, unspecified: Secondary | ICD-10-CM | POA: Diagnosis not present

## 2013-03-30 DIAGNOSIS — N183 Chronic kidney disease, stage 3 unspecified: Secondary | ICD-10-CM | POA: Diagnosis not present

## 2013-03-30 DIAGNOSIS — R809 Proteinuria, unspecified: Secondary | ICD-10-CM | POA: Diagnosis not present

## 2013-03-30 DIAGNOSIS — M329 Systemic lupus erythematosus, unspecified: Secondary | ICD-10-CM | POA: Diagnosis not present

## 2013-03-30 DIAGNOSIS — K219 Gastro-esophageal reflux disease without esophagitis: Secondary | ICD-10-CM | POA: Diagnosis not present

## 2013-03-30 DIAGNOSIS — Z79899 Other long term (current) drug therapy: Secondary | ICD-10-CM | POA: Diagnosis not present

## 2013-03-30 DIAGNOSIS — I10 Essential (primary) hypertension: Secondary | ICD-10-CM | POA: Diagnosis not present

## 2013-04-17 DIAGNOSIS — H25019 Cortical age-related cataract, unspecified eye: Secondary | ICD-10-CM | POA: Diagnosis not present

## 2013-04-17 DIAGNOSIS — Z79899 Other long term (current) drug therapy: Secondary | ICD-10-CM | POA: Diagnosis not present

## 2013-04-19 ENCOUNTER — Ambulatory Visit (HOSPITAL_COMMUNITY): Payer: Self-pay | Admitting: Psychiatry

## 2013-04-19 ENCOUNTER — Ambulatory Visit (INDEPENDENT_AMBULATORY_CARE_PROVIDER_SITE_OTHER): Payer: Medicare Other | Admitting: Psychiatry

## 2013-04-19 ENCOUNTER — Encounter (HOSPITAL_COMMUNITY): Payer: Self-pay | Admitting: Psychiatry

## 2013-04-19 VITALS — BP 151/88 | HR 56 | Ht 65.0 in | Wt 167.8 lb

## 2013-04-19 DIAGNOSIS — F329 Major depressive disorder, single episode, unspecified: Secondary | ICD-10-CM

## 2013-04-19 DIAGNOSIS — F0630 Mood disorder due to known physiological condition, unspecified: Secondary | ICD-10-CM

## 2013-04-19 MED ORDER — ALPRAZOLAM 1 MG PO TABS: 1.0000 mg | ORAL_TABLET | Freq: Two times a day (BID) | ORAL | Status: DC

## 2013-04-19 MED ORDER — BUPROPION HCL ER (XL) 150 MG PO TB24: 150.0000 mg | ORAL_TABLET | Freq: Every day | ORAL | Status: DC

## 2013-04-19 NOTE — Progress Notes (Signed)
Patient ID: Jackie Goodman, female   DOB: 08-Jul-1956, 57 y.o.   MRN: [id number]  Chief complaint Medication management followup.    History presenting illness Patient is a 57 year old African American divorced unemployed female who came for her followup appointment.  Her cousin who was very sick died in 2023-03-24.  Patient was very close to him.  She is handling the death of the cousin better she thought. She's compliant with Xanax and Wellbutrin.  She denies any recent agitation anger or mood swing.  She is also compliant with the nephrologist and she was recommended not to take any ibuprofen.  She is scheduled to see him again in 3 months.  She is sleeping better.  She denies any paranoia or any hallucination.  She also continue her Xanax and Wellbutrin.  She's not drinking or using any illegal substance.  Current psychiatric medication Wellbutrin XL 150 mg daily Xanax 1 mg twice a day.  Alcohol and substance use history She denies any history of alcohol or substance use in recent months however reported history of using cocaine drinking alcohol in 1993 .  Past psychiatric history Patient has history of depression since 1993 status post diagnosed with lupus and myositis .  She denies any history of inpatient psychiatric treatment or any suicidal attempt.  Family history Patient endorsed one of his brother has been receiving treatment in [place] [facility] however she do not know the details.  Psychosocial history Patient has been married once however her marriage last for only 24 months.  She has 2 children to .  She lives with her twin sister.  She has 4 other siblings.  Her mother died due to cancer and recently her father died due to complications and stopping kidney function.    Medical history Lupus, chronic renal insufficiency, myositis, headache, chronic pain .  She see Dr. Orson Aloe for her lupus in [place].  Her PCP is [facility].   Review of Systems  Constitutional: Positive for  malaise/fatigue.  HENT: Negative.   Cardiovascular: Negative.   Musculoskeletal: Positive for myalgias, back pain and joint pain.  Skin: Positive for rash. Negative for itching.  Neurological: Negative.  Negative for dizziness, tingling, seizures and loss of consciousness.  Psychiatric/Behavioral: Negative for depression, suicidal ideas, hallucinations, memory loss and substance abuse. The patient is not nervous/anxious.    Filed Vitals:   04/19/13 1033  BP: 151/88  Pulse: 56  Weight 163lbs  Mental status examination Patient is casually dressed and fairly groomed.  She appears to be in her stated age.  She is pleasant and cooperative.  She maintained good eye contact.  Her speech is slow and soft but clear and coherent.  Her thought processes logical and goal-directed.  She has no flight of ideas or loose association.  Her fund of knowledge is adequate.  She denies any active or passive suicidal thoughts or homicidal thoughts and she denies any auditory or visual hallucination.  She's alert and oriented x3.  Her insight judgment and impulse control is okay.  Assessment Axis I depressive disorder NOS , mood disorder due to general medical condition Axis II deferred Axis III  Patient Active Problem List   Diagnosis Date Noted  . Rosacea 08/08/2012  . Lupus 08/08/2012  . Multiple sclerosis 08/08/2012  . Chronic pain 08/08/2012  . Chronic renal insufficiency 08/08/2012  . Depression 11/23/2011   Axis IV mild to moderate Axis V 55-60  Plan I will continue Xanax and Wellbutrin present dose.  Recommend to have  her blood work faxed to [place] when she sees nephrologist.  I will see her again in 3 months.  Recommend to call us back if she is any question or concern.  Portion of this note is generated with voice dictation software and may contain typographical error.

## 2013-06-26 DIAGNOSIS — M329 Systemic lupus erythematosus, unspecified: Secondary | ICD-10-CM | POA: Diagnosis not present

## 2013-06-26 DIAGNOSIS — L819 Disorder of pigmentation, unspecified: Secondary | ICD-10-CM | POA: Diagnosis not present

## 2013-07-20 ENCOUNTER — Ambulatory Visit (INDEPENDENT_AMBULATORY_CARE_PROVIDER_SITE_OTHER): Payer: Medicare Other | Admitting: Psychiatry

## 2013-07-20 ENCOUNTER — Encounter (HOSPITAL_COMMUNITY): Payer: Self-pay | Admitting: Psychiatry

## 2013-07-20 VITALS — BP 138/82 | HR 56 | Ht 65.47 in | Wt 164.2 lb

## 2013-07-20 DIAGNOSIS — F0630 Mood disorder due to known physiological condition, unspecified: Secondary | ICD-10-CM

## 2013-07-20 DIAGNOSIS — F329 Major depressive disorder, single episode, unspecified: Secondary | ICD-10-CM | POA: Diagnosis not present

## 2013-07-20 DIAGNOSIS — F3289 Other specified depressive episodes: Secondary | ICD-10-CM

## 2013-07-20 MED ORDER — ALPRAZOLAM 1 MG PO TABS: 1.0000 mg | ORAL_TABLET | Freq: Two times a day (BID) | ORAL | Status: DC

## 2013-07-20 MED ORDER — BUPROPION HCL ER (XL) 150 MG PO TB24: 150.0000 mg | ORAL_TABLET | Freq: Every day | ORAL | Status: DC

## 2013-07-20 NOTE — Progress Notes (Signed)
[facility] [address] Progress Note  Jackie Goodman [id number] 57 y.o.  07/20/2013 10:11 AM  Chief Complaint:  I am doing better I need my medication.  History of Present Illness:  Jackie Goodman came for her followup appointment.  She is compliant with her psychotropic medication.  She sleeping better.  She continues to have a lot of bereavement and grief.  She has multiple debts in past few years.  This is the first time she does not have Thanksgiving at her place.  She is going to her sister's place.  She has mixed feelings about it.  She denied any panic attack or any feeling of hopelessness but she feels sometimes sad.  She sleeping okay.  She denies any irritability anger or any mood swing.  She denies any tremors or shakes.  She denies any hallucination or any paranoia.  She is not drinking or using any illegal substance.  She lives by herself.  She had a good support system from her family.  Patient seen her nephrologist in June.  There were no changes in her medication.  Suicidal Ideation: No Plan Formed: No Patient has means to carry out plan: No  Homicidal Ideation: No Plan Formed: No Patient has means to carry out plan: No  Medical History; Patient has lupus, chronic renal insufficiency, myositis, migraine headaches, chronic pain.  Most of her physicians are at [facility] .  Her rheumatologist is Dr. Orson Aloe, her nephrologist is Dr Marlinda Mike and her primary care physician is Dr. Loleta Chance in [place].  at [facility].  Her last blood work was done in June 2014 which she was cracking 1.6 and calcium 10.6.  Her C3 complement was 120 and C4 complement was 22.  Her vitamin D level was 12.  Her C-reactive protein was 0.25 which is normal.  Her creatinine kinase was 88.  Her CBC shows mild elevation of MCV (100) otherwise normal WBC count and platelets.  Her ESR was 22.  Family History; Patient endorse her brother and niece has history of depression.  Education and Work History; The patient is  on disability.  Psychosocial History; Patient lives alone.  She has a close relationship with her twin sister.  Her parents are deceased.  She has one daughter however patient does not have good relationship with her.  Her daughter has history of incarceration because of drug charges.  Substance Abuse History; Patient endorsed history of cocaine identity and occasional alcohol but denies any recent use of alcohol or any illegal substance use.  Past Psychiatric History/Hospitalization(s) Patient has history of depression since 1993 after she was diagnosed with lupus.  She was seeing psychiatrist at [facility] clinic however she was not happy and decided to change her psychiatrist.  She is seen in this office since April 2009.  In the past she has taken Zoloft.  Patient denies any inpatient psychiatric treatment, paranoia, hallucination or any suicidal thoughts. Anxiety: Yes Bipolar Disorder: No Depression: Yes Mania: No Psychosis: No Schizophrenia: No Personality Disorder: No Hospitalization for psychiatric illness: No History of Electroconvulsive Shock Therapy: No Prior Suicide Attempts: No   Review of Systems: Psychiatric: Agitation: No Hallucination: No Depressed Mood: No Insomnia: No Hypersomnia: No Altered Concentration: No Feels Worthless: No Grandiose Ideas: No Belief In Special Powers: No New/Increased Substance Abuse: No Compulsions: No  Neurologic: Headache: Yes Seizure: No Paresthesias: No    Outpatient Encounter Prescriptions as of 07/20/2013  Medication Sig  . ALPRAZolam (XANAX) 1 MG tablet Take 1 tablet (1 mg total) by mouth  2 (two) times daily.  Marland Kitchen buPROPion (WELLBUTRIN XL) 150 MG 24 hr tablet Take 1 tablet (150 mg total) by mouth daily.  . clobetasol (TEMOVATE) 0.05 % external solution   . cyclobenzaprine (FLEXERIL) 5 MG tablet Take 5 mg by mouth 3 (three) times daily as needed for muscle spasms.  . fluticasone (FLONASE) 50 MCG/ACT nasal spray   .  hydroxychloroquine (PLAQUENIL) 200 MG tablet Take 200 mg by mouth daily.  . metroNIDAZOLE (METROGEL) 0.75 % gel   . [DISCONTINUED] ALPRAZolam (XANAX) 1 MG tablet Take 1 tablet (1 mg total) by mouth 2 (two) times daily.  . [DISCONTINUED] buPROPion (WELLBUTRIN XL) 150 MG 24 hr tablet Take 1 tablet (150 mg total) by mouth daily.  . [DISCONTINUED] traMADol (ULTRAM) 50 MG tablet     No results found for this or any previous visit (from the past 2160 hour(s)).    Physical Exam: Constitutional:  BP 138/82  Pulse 56  Ht 5' 5.47" (1.663 m)  Wt 164 lb 3.2 oz (74.481 kg)  BMI 26.93 kg/m2  Musculoskeletal: Strength & Muscle Tone: within normal limits Gait & Station: normal Patient leans: N/A  Mental Status Examination;  Patient is casually dressed and fairly groomed. She appears to be in her stated age. She is cooperative and maintained good eye contact. Her speech is slow and soft but clear and coherent. Her thought processes logical and goal-directed. She has no flight of ideas or loose association. Her fund of knowledge is adequate. She denies any active or passive suicidal thoughts or homicidal thoughts and she denies any auditory or visual hallucination.  There were no delusions , paranoia or obsession .  She's alert and oriented x3. Her insight judgment and impulse control is okay.    Medical Decision Making (Choose Three): Established Problem, Stable/Improving (1), Review of Psycho-Social Stressors (1), Review or order clinical lab tests (1), Review of Last Therapy Session (1) and Review of Medication Regimen & Side Effects (2)  Assessment: Axis I: Depressive disorder NOS, mood disorder due to general medical condition  Axis II: Deferred  Axis III:  Past Medical History  Diagnosis Date  . Lupus   . Myositis   . CRI (chronic renal insufficiency)   . Rosacea   . Headache(784.0)   . HTN (hypertension)     Axis IV: Mild to moderate   Plan:  I review her blood work , current  medication, psychosocial stressors.  Patient is doing better on Wellbutrin and Xanax.  She has no side effects.  She is not interested in counseling.  She is anxious about Thanksgiving because this is the first time she is going to her sister's house for Thanksgiving.  Usually she had Thanksgiving at her place. Reassurance given.  Recommend to continue her current psychotropic medication.  Discussed the results.  Followup in 3 months.  Recommend to cause back she has any question or any concern.Time spent 25 minutes.  More than 50% of the time spent in psychoeducation, counseling and coordination of care.  Discuss safety plan that anytime having active suicidal thoughts or homicidal thoughts then patient need to call 911 or go to the local emergency room.    Takila Kronberg T., MD 07/20/2013

## 2013-08-03 DIAGNOSIS — Z79899 Other long term (current) drug therapy: Secondary | ICD-10-CM | POA: Diagnosis not present

## 2013-08-03 DIAGNOSIS — Z23 Encounter for immunization: Secondary | ICD-10-CM | POA: Diagnosis not present

## 2013-08-03 DIAGNOSIS — E559 Vitamin D deficiency, unspecified: Secondary | ICD-10-CM | POA: Diagnosis not present

## 2013-08-03 DIAGNOSIS — Z5181 Encounter for therapeutic drug level monitoring: Secondary | ICD-10-CM | POA: Diagnosis not present

## 2013-08-03 DIAGNOSIS — M3320 Polymyositis, organ involvement unspecified: Secondary | ICD-10-CM | POA: Diagnosis not present

## 2013-08-03 DIAGNOSIS — J309 Allergic rhinitis, unspecified: Secondary | ICD-10-CM | POA: Diagnosis not present

## 2013-08-03 DIAGNOSIS — IMO0001 Reserved for inherently not codable concepts without codable children: Secondary | ICD-10-CM | POA: Diagnosis not present

## 2013-08-03 DIAGNOSIS — M329 Systemic lupus erythematosus, unspecified: Secondary | ICD-10-CM | POA: Diagnosis not present

## 2013-09-19 ENCOUNTER — Telehealth: Payer: Self-pay

## 2013-09-19 NOTE — Telephone Encounter (Signed)
ENCOUNTER OPENED IN ERROR

## 2013-09-25 ENCOUNTER — Emergency Department (HOSPITAL_COMMUNITY): Payer: No Typology Code available for payment source

## 2013-09-25 ENCOUNTER — Emergency Department (HOSPITAL_COMMUNITY)
Admission: EM | Admit: 2013-09-25 | Discharge: 2013-09-25 | Disposition: A | Discharge status: Home / Self Care | Payer: No Typology Code available for payment source | Attending: Emergency Medicine | Admitting: Emergency Medicine

## 2013-09-25 ENCOUNTER — Encounter (HOSPITAL_COMMUNITY): Payer: Self-pay | Admitting: Emergency Medicine

## 2013-09-25 DIAGNOSIS — S8990XA Unspecified injury of unspecified lower leg, initial encounter: Secondary | ICD-10-CM | POA: Diagnosis present

## 2013-09-25 DIAGNOSIS — IMO0002 Reserved for concepts with insufficient information to code with codable children: Secondary | ICD-10-CM | POA: Diagnosis not present

## 2013-09-25 DIAGNOSIS — Y939 Activity, unspecified: Secondary | ICD-10-CM | POA: Diagnosis not present

## 2013-09-25 DIAGNOSIS — S8000XA Contusion of unspecified knee, initial encounter: Secondary | ICD-10-CM | POA: Diagnosis not present

## 2013-09-25 DIAGNOSIS — Z8739 Personal history of other diseases of the musculoskeletal system and connective tissue: Secondary | ICD-10-CM | POA: Insufficient documentation

## 2013-09-25 DIAGNOSIS — Z87448 Personal history of other diseases of urinary system: Secondary | ICD-10-CM | POA: Diagnosis not present

## 2013-09-25 DIAGNOSIS — F17200 Nicotine dependence, unspecified, uncomplicated: Secondary | ICD-10-CM | POA: Insufficient documentation

## 2013-09-25 DIAGNOSIS — S99919A Unspecified injury of unspecified ankle, initial encounter: Secondary | ICD-10-CM | POA: Diagnosis present

## 2013-09-25 DIAGNOSIS — Z872 Personal history of diseases of the skin and subcutaneous tissue: Secondary | ICD-10-CM | POA: Insufficient documentation

## 2013-09-25 DIAGNOSIS — W010XXA Fall on same level from slipping, tripping and stumbling without subsequent striking against object, initial encounter: Secondary | ICD-10-CM | POA: Insufficient documentation

## 2013-09-25 DIAGNOSIS — Y929 Unspecified place or not applicable: Secondary | ICD-10-CM | POA: Diagnosis not present

## 2013-09-25 DIAGNOSIS — Z79899 Other long term (current) drug therapy: Secondary | ICD-10-CM | POA: Insufficient documentation

## 2013-09-25 DIAGNOSIS — I10 Essential (primary) hypertension: Secondary | ICD-10-CM | POA: Diagnosis not present

## 2013-09-25 DIAGNOSIS — S8001XA Contusion of right knee, initial encounter: Secondary | ICD-10-CM

## 2013-09-25 MED ORDER — OXYCODONE HCL 5 MG PO TABS: 5.0000 mg | ORAL_TABLET | Freq: Four times a day (QID) | ORAL | Status: DC | PRN

## 2013-09-25 MED ORDER — OXYCODONE HCL 5 MG PO TABS
5.0000 mg | ORAL_TABLET | Freq: Once | ORAL | Status: AC
  Administered 2013-09-25: 5 mg via ORAL
  Filled 2013-09-25: qty 1

## 2013-09-25 NOTE — ED Notes (Signed)
Pt c/o right knee pain after a fall that occurred on 1/18

## 2013-09-25 NOTE — ED Provider Notes (Signed)
Medical screening examination/treatment/procedure(s) were performed by non-physician practitioner and as supervising physician I was immediately available for consultation/collaboration.  EKG Interpretation   None         Delice Bison Dayla Gasca, DO 09/25/13 2301

## 2013-09-25 NOTE — ED Notes (Signed)
Pt presents with c/o fall that occurred on 1/18; pt says she tripped on a rug and landed on her right knee; ambulatory to triage.

## 2013-09-25 NOTE — ED Notes (Signed)
Initial Contact - pt resting in chair, c/o 7/10 pain to R knee "it's in my muscles", MAEI, +csm/+pulses.  Pt also reports tingling in the R foot.  Reports onset after slip and fall x1 week ago.  Pt denies other complaints.  Skin PWD.  Speaking full/clear sentences.  NAD.  Awaiting dispo.

## 2013-09-25 NOTE — Discharge Instructions (Signed)
Contusion °A contusion is a deep bruise. Contusions happen when an injury causes bleeding under the skin. Signs of bruising include pain, puffiness (swelling), and discolored skin. The contusion may turn blue, purple, or yellow. °HOME CARE  °· Put ice on the injured area. °· Put ice in a plastic bag. °· Place a towel between your skin and the bag. °· Leave the ice on for 15-20 minutes, 03-04 times a day. °· Only take medicine as told by your doctor. °· Rest the injured area. °· If possible, raise (elevate) the injured area to lessen puffiness. °GET HELP RIGHT AWAY IF:  °· You have more bruising or puffiness. °· You have pain that is getting worse. °· Your puffiness or pain is not helped by medicine. °MAKE SURE YOU:  °· Understand these instructions. °· Will watch your condition. °· Will get help right away if you are not doing well or get worse. °Document Released: 02/03/2008 Document Revised: 11/09/2011 Document Reviewed: 06/22/2011 °[employer]® Patient Information ©2014 [employer]. ° °Cryotherapy °Cryotherapy means treatment with cold. Ice or gel packs can be used to reduce both pain and swelling. Ice is the most helpful within the first 24 to 48 hours after an injury or flareup from overusing a muscle or joint. Sprains, strains, spasms, burning pain, shooting pain, and aches can all be eased with ice. Ice can also be used when recovering from surgery. Ice is effective, has very few side effects, and is safe for most people to use. °PRECAUTIONS  °Ice is not a safe treatment option for people with: °· Raynaud's phenomenon. This is a condition affecting small blood vessels in the extremities. Exposure to cold may cause your problems to return. °· Cold hypersensitivity. There are many forms of cold hypersensitivity, including: °· Cold urticaria. Red, itchy hives appear on the skin when the tissues begin to warm after being iced. °· Cold erythema. This is a red, itchy rash caused by exposure to cold. °· Cold  hemoglobinuria. Red blood cells break down when the tissues begin to warm after being iced. The hemoglobin that carry oxygen are passed into the urine because they cannot combine with blood proteins fast enough. °· Numbness or altered sensitivity in the area being iced. °If you have any of the following conditions, do not use ice until you have discussed cryotherapy with your caregiver: °· Heart conditions, such as arrhythmia, angina, or chronic heart disease. °· High blood pressure. °· Healing wounds or open skin in the area being iced. °· Current infections. °· Rheumatoid arthritis. °· Poor circulation. °· Diabetes. °Ice slows the blood flow in the region it is applied. This is beneficial when trying to stop inflamed tissues from spreading irritating chemicals to surrounding tissues. However, if you expose your skin to cold temperatures for too long or without the proper protection, you can damage your skin or nerves. Watch for signs of skin damage due to cold. °HOME CARE INSTRUCTIONS °Follow these tips to use ice and cold packs safely. °· Place a dry or damp towel between the ice and skin. A damp towel will cool the skin more quickly, so you may need to shorten the time that the ice is used. °· For a more rapid response, add gentle compression to the ice. °· Ice for no more than 10 to 20 minutes at a time. The bonier the area you are icing, the less time it will take to get the benefits of ice. °· Check your skin after 5 minutes to make sure   there are no signs of a poor response to cold or skin damage.  Rest 20 minutes or more in between uses.  Once your skin is numb, you can end your treatment. You can test numbness by very lightly touching your skin. The touch should be so light that you do not see the skin dimple from the pressure of your fingertip. When using ice, most people will feel these normal sensations in this order: cold, burning, aching, and numbness.  Do not use ice on someone who cannot  communicate their responses to pain, such as small children or people with dementia. HOW TO MAKE AN ICE PACK Ice packs are the most common way to use ice therapy. Other methods include ice massage, ice baths, and cryo-sprays. Muscle creams that cause a cold, tingly feeling do not offer the same benefits that ice offers and should not be used as a substitute unless recommended by your caregiver. To make an ice pack, do one of the following:  Place crushed ice or a bag of frozen vegetables in a sealable plastic bag. Squeeze out the excess air. Place this bag inside another plastic bag. Slide the bag into a pillowcase or place a damp towel between your skin and the bag.  Mix 3 parts water with 1 part rubbing alcohol. Freeze the mixture in a sealable plastic bag. When you remove the mixture from the freezer, it will be slushy. Squeeze out the excess air. Place this bag inside another plastic bag. Slide the bag into a pillowcase or place a damp towel between your skin and the bag. SEEK MEDICAL CARE IF:  You develop white spots on your skin. This may give the skin a blotchy (mottled) appearance.  Your skin turns blue or pale.  Your skin becomes waxy or hard.  Your swelling gets worse. MAKE SURE YOU:   Understand these instructions.  Will watch your condition.  Will get help right away if you are not doing well or get worse. Document Released: 04/13/2011 Document Revised: 11/09/2011 Document Reviewed: 04/13/2011 [facility] Patient Information 2014 [place], [place]. Your x-ray is normal.  You have  been placed in a knee immobilizer to aid in, comfort, you've also been referred to orthopedic surgery for further evaluation and physical therapy

## 2013-09-25 NOTE — ED Provider Notes (Signed)
CSN: [id number]     Arrival date & time 09/25/13  1912 History  This chart was scribed for non-physician practitioner Junius Creamer working with [place], DO by Donato Schultz, ED Scribe. This patient was seen in room WTR5/WTR5 and the patient's care was started at 8:58 PM.     Chief Complaint  Patient presents with  . Fall  . Knee Pain   Patient is a 58 y.o. female presenting with fall and knee pain. The history is provided by the patient. No language interpreter was used.  Fall This is a new problem. The current episode started 1 to 4 weeks ago. The problem occurs constantly. The problem has been unchanged. The symptoms are aggravated by walking and exertion. She has tried ice for the symptoms. The treatment provided no relief.  Knee Pain Fall This is a new problem. The current episode started 1 to 4 weeks ago. The problem occurs constantly. The problem has been unchanged. Associated symptoms include arthralgias (right knee) and numbness. Pertinent negatives include no joint swelling or weakness. The symptoms are aggravated by walking and exertion. She has tried ice for the symptoms. The treatment provided no relief.   HPI Comments: Jackie Goodman is a 58 y.o. female with a history of MS and Lupus who presents to the Emergency Department complaining of constant, tingling, numb, aching right knee pain that started eight days ago after the patient tripped on a rug and fell on her right knee.  She states that she noticed ecchymosis and edema on the right knee.  The patient states that she has applied ice and heat to the right knee with no relief to her symptoms.  She denies taking any Tylenol or Ibuprofen for her symptoms.  She states that she has hurt her right knee before after falling in a parking lot.  She states that she treated that injury by seeing an orthopedist and participating in physical therapy.  She states she was in physical therapy for 4-6 weeks.  The patient states that she is  allergic to Amoxicillin, Sulfa Antibiotics, Vicodin, or Tetracyclines.  She states that her PCP is Dr. [name].   Past Medical History  Diagnosis Date  . Lupus   . Myositis   . CRI (chronic renal insufficiency)   . Rosacea   . Headache(784.0)   . HTN (hypertension)    Past Surgical History  Procedure Laterality Date  . Tonsillectomy    . Abdominal hysterectomy     Family History  Problem Relation Age of Onset  . Depression Brother    History  Substance Use Topics  . Smoking status: Current Every Day Smoker  . Smokeless tobacco: Not on file  . Alcohol Use: No   OB History   Grav Para Term Preterm Abortions TAB SAB Ect Mult Living                 Review of Systems  Musculoskeletal: Positive for arthralgias (right knee). Negative for joint swelling.  Skin: Negative for wound.  Neurological: Positive for numbness. Negative for dizziness and weakness.  All other systems reviewed and are negative.    Allergies  Amoxicillin; Sulfa antibiotics; Tetracyclines & related; and Vicodin  Home Medications   Current Outpatient Rx  Name  Route  Sig  Dispense  Refill  . ALPRAZolam (XANAX) 1 MG tablet   Oral   Take 1 tablet (1 mg total) by mouth 2 (two) times daily.   60 tablet   2   .  buPROPion (WELLBUTRIN XL) 150 MG 24 hr tablet   Oral   Take 1 tablet (150 mg total) by mouth daily.   30 tablet   2   . clobetasol (TEMOVATE) 0.05 % external solution   Topical   Apply 1 application topically 2 (two) times daily.          . cyclobenzaprine (FLEXERIL) 5 MG tablet   Oral   Take 5 mg by mouth 3 (three) times daily as needed for muscle spasms.         . fluticasone (FLONASE) 50 MCG/ACT nasal spray   Nasal   Place 1 spray into the nose daily.          . hydroxychloroquine (PLAQUENIL) 200 MG tablet   Oral   Take 200 mg by mouth 2 (two) times daily.          . metroNIDAZOLE (METROGEL) 0.75 % gel               . omeprazole (PRILOSEC) 20 MG capsule   Oral    Take 20 mg by mouth 2 (two) times daily before a meal.         . oxyCODONE (OXY IR/ROXICODONE) 5 MG immediate release tablet   Oral   Take 1 tablet (5 mg total) by mouth every 6 (six) hours as needed for severe pain.   12 tablet   0    Triage Vitals: BP 150/88  Pulse 52  Temp(Src) 98.2 F (36.8 C) (Oral)  Resp 24  SpO2 100%  Physical Exam  Nursing note and vitals reviewed. Constitutional: She is oriented to person, place, and time. She appears well-developed and well-nourished. No distress.  HENT:  Head: Normocephalic and atraumatic.  Eyes: EOM are normal.  Neck: Neck supple.  Cardiovascular: Normal rate.   Right knee: Good pulses.    Pulmonary/Chest: Effort normal. No respiratory distress.  Musculoskeletal: Normal range of motion. She exhibits tenderness. She exhibits no edema.  Right knee: No deformity or ecchymosis.  Minimal swelling.    Neurological: She is alert and oriented to person, place, and time.  Skin: Skin is warm and dry. No erythema.  Psychiatric: She has a normal mood and affect. Her behavior is normal.    ED Course  Procedures (including critical care time)  DIAGNOSTIC STUDIES: Oxygen Saturation is 100% on room air, normal by my interpretation.    COORDINATION OF CARE: 9:02 PM- Discussed x-raying the patient right knee and the patient agreed to the treatment plan.   Labs Review Labs Reviewed - No data to display Imaging Review Dg Knee Complete 4 Views Right  09/25/2013   CLINICAL DATA:  Fall, knee pain, swelling  EXAM: RIGHT KNEE - COMPLETE 4+ VIEW  COMPARISON:  05/28/2010  FINDINGS: There is no evidence of fracture, dislocation, or joint effusion. There is no evidence of arthropathy or other focal bone abnormality. Soft tissues are unremarkable.  IMPRESSION: No acute osseous finding   Electronically Signed   By: Daryll Brod M.D.   On: 09/25/2013 22:18    EKG Interpretation   None       MDM   1. Contusion of knee, right      I  personally performed the services described in this documentation, which was scribed in my presence. The recorded information has been reviewed and is accurate.     Garald Balding, NP 09/25/13 2221

## 2013-09-28 DIAGNOSIS — I10 Essential (primary) hypertension: Secondary | ICD-10-CM | POA: Diagnosis not present

## 2013-09-28 DIAGNOSIS — N183 Chronic kidney disease, stage 3 unspecified: Secondary | ICD-10-CM | POA: Diagnosis not present

## 2013-09-28 DIAGNOSIS — R809 Proteinuria, unspecified: Secondary | ICD-10-CM | POA: Diagnosis not present

## 2013-10-20 ENCOUNTER — Encounter (HOSPITAL_COMMUNITY): Payer: Self-pay | Admitting: Psychiatry

## 2013-10-20 ENCOUNTER — Ambulatory Visit (INDEPENDENT_AMBULATORY_CARE_PROVIDER_SITE_OTHER): Payer: Medicare Other | Admitting: Psychiatry

## 2013-10-20 DIAGNOSIS — F0630 Mood disorder due to known physiological condition, unspecified: Secondary | ICD-10-CM

## 2013-10-20 DIAGNOSIS — F3289 Other specified depressive episodes: Secondary | ICD-10-CM | POA: Diagnosis not present

## 2013-10-20 DIAGNOSIS — F329 Major depressive disorder, single episode, unspecified: Secondary | ICD-10-CM

## 2013-10-20 MED ORDER — ALPRAZOLAM 1 MG PO TABS: 1.0000 mg | ORAL_TABLET | Freq: Two times a day (BID) | ORAL | Status: DC

## 2013-10-20 MED ORDER — BUPROPION HCL ER (XL) 150 MG PO TB24: 150.0000 mg | ORAL_TABLET | Freq: Every day | ORAL | Status: DC

## 2013-10-20 NOTE — Progress Notes (Signed)
Jackie Goodman  Jackie Goodman [id number] 58 y.o.  10/20/2013 10:20 AM  Chief Complaint:  Medication management and followup.  History of Present Illness:  Jackie Goodman came for her followup appointment.  She is compliant with her psychotropic medication.  She denies any side effects of medication.  Her sleep and appetite is unchanged from the past.  She denies any recent panic attack or any crying spells.  She had a good Christmas.  She denies any tremors or shakes.  Recently she visited emergency room because of all and she may schedule to see orthopedic physician.  She was given pain medication but she finished.  Her pain is much better.  Patient denies any hallucination or any paranoia.  She is not drinking or using any illegal substance.  She lives by herself.  She had a good support system from her family.  Patient saw her nephrologist recently and she had blood work in January 2015.  She brought the results.  Her chemistry is normal except her calcium was high and her creatinine is 1.5 which is her baseline.  She was recommended to stop calcium and vitamin D .   Suicidal Ideation: No Plan Formed: No Patient has means to carry out plan: No  Homicidal Ideation: No Plan Formed: No Patient has means to carry out plan: No  Medical History; Patient has lupus, chronic renal insufficiency, myositis, migraine headaches, chronic pain.  Most of her physicians are at [facility] .  Her rheumatologist is Dr. [name], her nephrologist is Dr [name] and her primary care physician is Dr. [name] in [place].    Psychosocial History; Patient lives alone.  She has a close relationship with her twin sister.  Her parents are deceased.  She has one daughter however patient does not have good relationship with her.  Her daughter has history of incarceration because of drug charges.  Past Psychiatric History/Hospitalization(s) Patient has history of depression since 1993 after she  was diagnosed with lupus.  She was seeing psychiatrist at [facility] however she was not happy and decided to change her psychiatrist.  She is seen in this office since April 2009.  In the past she has taken Zoloft.  Patient denies any inpatient psychiatric treatment, paranoia, hallucination or any suicidal thoughts. Anxiety: Yes Bipolar Disorder: No Depression: Yes Mania: No Psychosis: No Schizophrenia: No Personality Disorder: No Hospitalization for psychiatric illness: No History of Electroconvulsive Shock Therapy: No Prior Suicide Attempts: No   Review of Systems: Psychiatric: Agitation: No Hallucination: No Depressed Mood: No Insomnia: No Hypersomnia: No Altered Concentration: No Feels Worthless: No Grandiose Ideas: No Belief In Special Powers: No New/Increased Substance Abuse: No Compulsions: No  Neurologic: Headache: No Seizure: No Paresthesias: No    Outpatient Encounter Prescriptions as of 10/20/2013  Medication Sig  . ALPRAZolam (XANAX) 1 MG tablet Take 1 tablet (1 mg total) by mouth 2 (two) times daily.  Marland Kitchen buPROPion (WELLBUTRIN XL) 150 MG 24 hr tablet Take 1 tablet (150 mg total) by mouth daily.  . clobetasol (TEMOVATE) 0.05 % external solution Apply 1 application topically 2 (two) times daily.   . cyclobenzaprine (FLEXERIL) 5 MG tablet Take 5 mg by mouth 3 (three) times daily as needed for muscle spasms.  . fluticasone (FLONASE) 50 MCG/ACT nasal spray Place 1 spray into the nose daily.   . hydroxychloroquine (PLAQUENIL) 200 MG tablet Take 200 mg by mouth 2 (two) times daily.   . metroNIDAZOLE (METROGEL) 0.75 % gel   . omeprazole (  PRILOSEC) 20 MG capsule Take 20 mg by mouth 2 (two) times daily before a meal.  . [DISCONTINUED] ALPRAZolam (XANAX) 1 MG tablet Take 1 tablet (1 mg total) by mouth 2 (two) times daily.  . [DISCONTINUED] buPROPion (WELLBUTRIN XL) 150 MG 24 hr tablet Take 1 tablet (150 mg total) by mouth daily.  . [DISCONTINUED] oxyCODONE (OXY  IR/ROXICODONE) 5 MG immediate release tablet Take 1 tablet (5 mg total) by mouth every 6 (six) hours as needed for severe pain.    No results found for this or any previous visit (from the past 2160 hour(s)).   Physical Exam: Constitutional:  There were no vitals taken for this visit.  Musculoskeletal: Strength & Muscle Tone: within normal limits Gait & Station: normal Patient leans: N/A  Mental Status Examination;  Patient is casually dressed and fairly groomed.  She is cooperative and maintained good eye contact. Her speech is slow and soft but clear and coherent. Her thought processes logical and goal-directed. She has no flight of ideas or loose association. Her fund of knowledge is adequate. She denies any active or passive suicidal thoughts or homicidal thoughts and she denies any auditory or visual hallucination.  There were no delusions , paranoia or obsession .  She's alert and oriented x3. Her insight judgment and impulse control is okay.  Established Problem, Stable/Improving (1), Review or order clinical lab tests (1), Review of Last Therapy Session (1) and Review of Medication Regimen & Side Effects (2)  Assessment: Axis I: Depressive disorder NOS, mood disorder due to general medical condition  Axis II: Deferred  Axis III:  Past Medical History  Diagnosis Date  . Lupus   . Myositis   . CRI (chronic renal insufficiency)   . Rosacea   . Headache(784.0)   . HTN (hypertension)     Axis IV: Mild to moderate   Plan:  I review her blood work , current medication, psychosocial stressors.  Patient is doing better on Wellbutrin and Xanax.  She has no side effects.  Recommend to continue her current psychotropic medication.  Discussed the risks and benefits of medication.  Recommend to call us back if she has any question or any concern.   Followup in 3 months.     Jackie Yackel T., MD 10/20/2013

## 2013-12-28 DIAGNOSIS — I498 Other specified cardiac arrhythmias: Secondary | ICD-10-CM | POA: Diagnosis not present

## 2013-12-28 DIAGNOSIS — Z9181 History of falling: Secondary | ICD-10-CM | POA: Diagnosis not present

## 2013-12-28 DIAGNOSIS — E569 Vitamin deficiency, unspecified: Secondary | ICD-10-CM | POA: Diagnosis not present

## 2013-12-28 DIAGNOSIS — M3320 Polymyositis, organ involvement unspecified: Secondary | ICD-10-CM | POA: Diagnosis not present

## 2013-12-28 DIAGNOSIS — M329 Systemic lupus erythematosus, unspecified: Secondary | ICD-10-CM | POA: Diagnosis not present

## 2014-01-17 ENCOUNTER — Ambulatory Visit (HOSPITAL_COMMUNITY): Payer: Self-pay | Admitting: Psychiatry

## 2014-01-18 ENCOUNTER — Ambulatory Visit (INDEPENDENT_AMBULATORY_CARE_PROVIDER_SITE_OTHER): Payer: Medicare Other | Admitting: Psychiatry

## 2014-01-18 ENCOUNTER — Encounter (HOSPITAL_COMMUNITY): Payer: Self-pay | Admitting: Psychiatry

## 2014-01-18 ENCOUNTER — Encounter (INDEPENDENT_AMBULATORY_CARE_PROVIDER_SITE_OTHER): Payer: Self-pay

## 2014-01-18 VITALS — BP 161/91 | HR 53 | Ht 65.5 in | Wt 164.2 lb

## 2014-01-18 DIAGNOSIS — F0630 Mood disorder due to known physiological condition, unspecified: Secondary | ICD-10-CM

## 2014-01-18 DIAGNOSIS — F3289 Other specified depressive episodes: Secondary | ICD-10-CM | POA: Diagnosis not present

## 2014-01-18 DIAGNOSIS — F329 Major depressive disorder, single episode, unspecified: Secondary | ICD-10-CM

## 2014-01-18 MED ORDER — BUPROPION HCL ER (XL) 150 MG PO TB24: 150.0000 mg | ORAL_TABLET | Freq: Every day | ORAL | Status: DC

## 2014-01-18 MED ORDER — ALPRAZOLAM 1 MG PO TABS: 1.0000 mg | ORAL_TABLET | Freq: Two times a day (BID) | ORAL | Status: DC

## 2014-01-18 NOTE — Progress Notes (Signed)
[place] Progress Note  Jackie Goodman [id number] 57 y.o.  01/18/2014 11:09 AM  Chief Complaint:  Medication management and followup.  History of Present Illness:  Jackie Goodman came for her followup appointment.  She is compliant with her Wellbutrin and Xanax.  She denies any side effects.  She recently seen a specialist at [facility] and there has been no changes.  She denies any irritability, anger or any mood swing.  She is sleeping better.  She denies any crying spells.  She has no tremors or shakes.  She is happy because her daughter is doing very well and she is being working at the same place for more than 3 years.  There has been no new issues and she reported that her relationship with her daughter is much improved.  Patient denies any hallucination or any paranoia.  She is not drinking or using any illegal substances.  She endorsed that her current medicine is working very well.  Her appetite is good.  Her perpetrator was slightly high but patient told that she has not taken the morning medication.  She has no symptoms including chest pain or dizziness.  Patient lives by herself.  She has a good relationship with her twin sister who accompanies her on her doctor's appointment.  Suicidal Ideation: No Plan Formed: No Patient has means to carry out plan: No  Homicidal Ideation: No Plan Formed: No Patient has means to carry out plan: No  Medical History; Patient has lupus, chronic renal insufficiency, myositis, migraine headaches, chronic pain.  Most of her physicians are at [facility] .  Her rheumatologist is Dr. [name], her nephrologist is Dr [name] and her primary care physician is Dr. [name] in [place].    Past Psychiatric History/Hospitalization(s) Patient has history of depression since 1993 after she was diagnosed with lupus.  She was seeing psychiatrist at [facility] clinic however she was not happy and decided to change her psychiatrist.  She is seen in this  office since April 2009.  In the past she has taken Zoloft.  Patient denies any inpatient psychiatric treatment, paranoia, hallucination or any suicidal thoughts. Anxiety: Yes Bipolar Disorder: No Depression: Yes Mania: No Psychosis: No Schizophrenia: No Personality Disorder: No Hospitalization for psychiatric illness: No History of Electroconvulsive Shock Therapy: No Prior Suicide Attempts: No   Review of Systems: Psychiatric: Agitation: No Hallucination: No Depressed Mood: No Insomnia: No Hypersomnia: No Altered Concentration: No Feels Worthless: No Grandiose Ideas: No Belief In Special Powers: No New/Increased Substance Abuse: No Compulsions: No  Neurologic: Headache: No Seizure: No Paresthesias: No    Outpatient Encounter Prescriptions as of 01/18/2014  Medication Sig  . ALPRAZolam (XANAX) 1 MG tablet Take 1 tablet (1 mg total) by mouth 2 (two) times daily.  Marland Kitchen buPROPion (WELLBUTRIN XL) 150 MG 24 hr tablet Take 1 tablet (150 mg total) by mouth daily.  . [DISCONTINUED] ALPRAZolam (XANAX) 1 MG tablet Take 1 tablet (1 mg total) by mouth 2 (two) times daily.  . [DISCONTINUED] buPROPion (WELLBUTRIN XL) 150 MG 24 hr tablet Take 1 tablet (150 mg total) by mouth daily.  . clobetasol (TEMOVATE) 0.05 % external solution Apply 1 application topically 2 (two) times daily.   . cyclobenzaprine (FLEXERIL) 5 MG tablet Take 5 mg by mouth 3 (three) times daily as needed for muscle spasms.  . fluticasone (FLONASE) 50 MCG/ACT nasal spray Place 1 spray into the nose daily.   . hydroxychloroquine (PLAQUENIL) 200 MG tablet Take 200 mg by mouth 2 (two)  times daily.   . metroNIDAZOLE (METROGEL) 0.75 % gel   . omeprazole (PRILOSEC) 20 MG capsule Take 20 mg by mouth 2 (two) times daily before a meal.    No results found for this or any previous visit (from the past 2160 hour(s)).   Physical Exam: Constitutional:  BP 161/91  Pulse 53  Ht 5' 5.5" (1.664 m)  Wt 164 lb 3.2 oz (74.481 kg)   BMI 26.90 kg/m2  Musculoskeletal: Strength & Muscle Tone: within normal limits Gait & Station: normal Patient leans: N/A  Mental Status Examination;  Patient is casually dressed and fairly groomed.  She is cooperative and maintained good eye contact. Her speech is slow and soft but clear and coherent. Her thought processes logical and goal-directed. She has no flight of ideas or loose association. Her fund of knowledge is adequate. She denies any active or passive suicidal thoughts or homicidal thoughts and she denies any auditory or visual hallucination.  There were no delusions , paranoia or obsession .  She's alert and oriented x3. Her insight judgment and impulse control is okay.  Established Problem, Stable/Improving (1), Review of Last Therapy Session (1) and Review of Medication Regimen & Side Effects (2)  Assessment: Axis I: Depressive disorder NOS, mood disorder due to general medical condition  Axis II: Deferred  Axis III:  Past Medical History  Diagnosis Date  . Lupus   . Myositis   . CRI (chronic renal insufficiency)   . Rosacea   . Headache(784.0)   . HTN (hypertension)     Axis IV: Mild to moderate   Plan:  Patient is doing better on Wellbutrin and Xanax.  She has no side effects.  Recommend to continue her current psychotropic medication.  Discussed the risks and benefits of medication.  Recommend to call us back if she has any question or any concern.   Followup in 3 months.     ARFEEN,SYED T., MD 01/18/2014

## 2014-03-05 DIAGNOSIS — M3320 Polymyositis, organ involvement unspecified: Secondary | ICD-10-CM | POA: Diagnosis not present

## 2014-03-05 DIAGNOSIS — M329 Systemic lupus erythematosus, unspecified: Secondary | ICD-10-CM | POA: Diagnosis not present

## 2014-03-05 DIAGNOSIS — R29898 Other symptoms and signs involving the musculoskeletal system: Secondary | ICD-10-CM | POA: Diagnosis not present

## 2014-03-05 DIAGNOSIS — Z9181 History of falling: Secondary | ICD-10-CM | POA: Diagnosis not present

## 2014-04-20 ENCOUNTER — Ambulatory Visit (HOSPITAL_COMMUNITY): Payer: Self-pay | Admitting: Psychiatry

## 2014-04-23 ENCOUNTER — Ambulatory Visit (HOSPITAL_COMMUNITY): Payer: Self-pay | Admitting: Psychiatry

## 2014-04-26 ENCOUNTER — Encounter (HOSPITAL_COMMUNITY): Payer: Self-pay | Admitting: Psychiatry

## 2014-04-26 ENCOUNTER — Ambulatory Visit (INDEPENDENT_AMBULATORY_CARE_PROVIDER_SITE_OTHER): Payer: Medicare Other | Admitting: Psychiatry

## 2014-04-26 VITALS — BP 126/81 | HR 60 | Ht 65.0 in | Wt 160.1 lb

## 2014-04-26 DIAGNOSIS — F3289 Other specified depressive episodes: Secondary | ICD-10-CM

## 2014-04-26 DIAGNOSIS — F329 Major depressive disorder, single episode, unspecified: Secondary | ICD-10-CM | POA: Diagnosis not present

## 2014-04-26 DIAGNOSIS — F0630 Mood disorder due to known physiological condition, unspecified: Secondary | ICD-10-CM

## 2014-04-26 MED ORDER — ALPRAZOLAM 1 MG PO TABS: 1.0000 mg | ORAL_TABLET | Freq: Two times a day (BID) | ORAL | Status: DC

## 2014-04-26 MED ORDER — BUPROPION HCL ER (XL) 150 MG PO TB24: 150.0000 mg | ORAL_TABLET | Freq: Every day | ORAL | Status: DC

## 2014-04-26 NOTE — Progress Notes (Signed)
[facility] [phone] Progress Note  Jackie Goodman [id number] 58 y.o.  04/26/2014 10:59 AM  Chief Complaint:  Medication management and followup.  History of Present Illness:  Jackie Goodman came for her followup appointment.  She is taking her medication and denies any side effects. Recently her fiend died because of complication of diabetes and dialysis.  Patient told me that it reminds her father who died a few years ago due to complications of diabetes and dialysis.  Overall she is doing very well.  She has a great-grandson 3 months ago.  She had a good relationship with her daughter.  Her job is going very well.  She denies any agitation, irritability, anger or any depressive thoughts.  However she does feel sometimes sad and tearful when she remember the loss of her friend and her father.  Her sleep is good.  Her vitals are stable.  She has no tremors or shakes.  She recently seen her neurologist at [facility].  She had EMG test which is normal.  There has been no new changes in her medication.  Suicidal Ideation: No Plan Formed: No Patient has means to carry out plan: No  Homicidal Ideation: No Plan Formed: No Patient has means to carry out plan: No  Medical History; Patient has lupus, chronic renal insufficiency, myositis, migraine headaches, chronic pain.  Most of her physicians are at [facility] .  Her rheumatologist is Dr. Koleen Goodman, her nephrologist is Dr Jackie Goodman and her primary care physician is Dr. Berdine Goodman in [place].    Past Psychiatric History/Hospitalization(s) Patient has history of depression since 1993 after she was diagnosed with lupus.  She was seeing psychiatrist at [facility] clinic however she was not happy and decided to change her psychiatrist.  She is seen in this office since April 2009.  In the past she has taken Zoloft.  Patient denies any inpatient psychiatric treatment, paranoia, hallucination or any suicidal thoughts. Anxiety: Yes Bipolar Disorder: No Depression:  No Mania: No Psychosis: No Schizophrenia: No Personality Disorder: No Hospitalization for psychiatric illness: No History of Electroconvulsive Shock Therapy: No Prior Suicide Attempts: No   Review of Systems: Psychiatric: Agitation: No Hallucination: No Depressed Mood: No Insomnia: No Hypersomnia: No Altered Concentration: No Feels Worthless: No Grandiose Ideas: No Belief In Special Powers: No New/Increased Substance Abuse: No Compulsions: No  Neurologic: Headache: No Seizure: No Paresthesias: No    Outpatient Encounter Prescriptions as of 04/26/2014  Medication Sig  . ALPRAZolam (XANAX) 1 MG tablet Take 1 tablet (1 mg total) by mouth 2 (two) times daily.  Marland Kitchen buPROPion (WELLBUTRIN XL) 150 MG 24 hr tablet Take 1 tablet (150 mg total) by mouth daily.  . clobetasol (TEMOVATE) 0.05 % external solution Apply 1 application topically 2 (two) times daily.   . cyclobenzaprine (FLEXERIL) 5 MG tablet Take 5 mg by mouth 3 (three) times daily as needed for muscle spasms.  . fluticasone (FLONASE) 50 MCG/ACT nasal spray Place 1 spray into the nose daily.   . hydroxychloroquine (PLAQUENIL) 200 MG tablet Take 200 mg by mouth 2 (two) times daily.   . metroNIDAZOLE (METROGEL) 0.75 % gel   . omeprazole (PRILOSEC) 20 MG capsule Take 20 mg by mouth 2 (two) times daily before a meal.  . [DISCONTINUED] ALPRAZolam (XANAX) 1 MG tablet Take 1 tablet (1 mg total) by mouth 2 (two) times daily.  . [DISCONTINUED] buPROPion (WELLBUTRIN XL) 150 MG 24 hr tablet Take 1 tablet (150 mg total) by mouth daily.    No results found  for this or any previous visit (from the past 2160 hour(s)).   Physical Exam: Constitutional:  BP 126/81  Pulse 60  Ht 5\' 5"  (1.651 m)  Wt 160 lb 1.6 oz (72.621 kg)  BMI 26.64 kg/m2  Musculoskeletal: Strength & Muscle Tone: within normal limits Gait & Station: normal Patient leans: N/A  Mental Status Examination;  Patient is casually dressed and fairly groomed.  She is  cooperative and maintained good eye contact. Her speech is slow and soft but clear and coherent. Her thought processes logical and goal-directed. She has no flight of ideas or loose association. Her fund of knowledge is adequate. She denies any active or passive suicidal thoughts or homicidal thoughts and she denies any auditory or visual hallucination.  There were no delusions , paranoia or obsession .  She's alert and oriented x3. Her insight judgment and impulse control is okay.  Established Problem, Stable/Improving (1), Review of Last Therapy Session (1) and Review of Medication Regimen & Side Effects (2)  Assessment: Axis I: Depressive disorder NOS, mood disorder due to general medical condition  Axis II: Deferred  Axis III:  Past Medical History  Diagnosis Date  . Lupus   . Myositis   . CRI (chronic renal insufficiency)   . Rosacea   . Headache(784.0)   . HTN (hypertension)     Axis IV: Mild to moderate   Plan:  Reassurance given and recommended if she needed to see a therapist for grief counseling.  Patient does not feel to see a counselor at this time for grief however she promised that she would call us if she needed.  I will continue Wellbutrin and Xanax.  She does not have any side effects.  Recommended to call us back if she has any question or any concern.  Follow up in 3 months.  Jackie Goodman T., MD 04/26/2014

## 2014-04-30 DIAGNOSIS — Z79899 Other long term (current) drug therapy: Secondary | ICD-10-CM | POA: Diagnosis not present

## 2014-04-30 DIAGNOSIS — H25019 Cortical age-related cataract, unspecified eye: Secondary | ICD-10-CM | POA: Diagnosis not present

## 2014-04-30 DIAGNOSIS — H2510 Age-related nuclear cataract, unspecified eye: Secondary | ICD-10-CM | POA: Diagnosis not present

## 2014-05-03 DIAGNOSIS — N183 Chronic kidney disease, stage 3 unspecified: Secondary | ICD-10-CM | POA: Diagnosis not present

## 2014-05-03 DIAGNOSIS — J301 Allergic rhinitis due to pollen: Secondary | ICD-10-CM | POA: Diagnosis not present

## 2014-05-03 DIAGNOSIS — Z8379 Family history of other diseases of the digestive system: Secondary | ICD-10-CM | POA: Diagnosis not present

## 2014-05-03 DIAGNOSIS — R809 Proteinuria, unspecified: Secondary | ICD-10-CM | POA: Diagnosis not present

## 2014-05-03 DIAGNOSIS — I10 Essential (primary) hypertension: Secondary | ICD-10-CM | POA: Diagnosis not present

## 2014-05-03 DIAGNOSIS — M3320 Polymyositis, organ involvement unspecified: Secondary | ICD-10-CM | POA: Diagnosis not present

## 2014-05-19 DIAGNOSIS — Z23 Encounter for immunization: Secondary | ICD-10-CM | POA: Diagnosis not present

## 2014-07-23 ENCOUNTER — Ambulatory Visit (HOSPITAL_COMMUNITY): Payer: Self-pay | Admitting: Psychiatry

## 2014-08-07 DIAGNOSIS — H01119 Allergic dermatitis of unspecified eye, unspecified eyelid: Secondary | ICD-10-CM | POA: Diagnosis not present

## 2014-08-07 DIAGNOSIS — L821 Other seborrheic keratosis: Secondary | ICD-10-CM | POA: Diagnosis not present

## 2014-08-07 DIAGNOSIS — D485 Neoplasm of uncertain behavior of skin: Secondary | ICD-10-CM | POA: Diagnosis not present

## 2014-08-07 DIAGNOSIS — D239 Other benign neoplasm of skin, unspecified: Secondary | ICD-10-CM | POA: Diagnosis not present

## 2014-08-07 DIAGNOSIS — L819 Disorder of pigmentation, unspecified: Secondary | ICD-10-CM | POA: Diagnosis not present

## 2014-08-08 ENCOUNTER — Ambulatory Visit (INDEPENDENT_AMBULATORY_CARE_PROVIDER_SITE_OTHER): Payer: Medicare Other | Admitting: Psychiatry

## 2014-08-08 ENCOUNTER — Encounter (HOSPITAL_COMMUNITY): Payer: Self-pay | Admitting: Psychiatry

## 2014-08-08 VITALS — BP 148/90 | HR 50 | Ht 65.0 in | Wt 160.6 lb

## 2014-08-08 DIAGNOSIS — F32A Depression, unspecified: Secondary | ICD-10-CM

## 2014-08-08 DIAGNOSIS — F39 Unspecified mood [affective] disorder: Secondary | ICD-10-CM | POA: Diagnosis not present

## 2014-08-08 DIAGNOSIS — F329 Major depressive disorder, single episode, unspecified: Secondary | ICD-10-CM | POA: Diagnosis not present

## 2014-08-08 MED ORDER — BUPROPION HCL ER (XL) 150 MG PO TB24: 150.0000 mg | ORAL_TABLET | Freq: Every day | ORAL | Status: DC

## 2014-08-08 MED ORDER — ALPRAZOLAM 1 MG PO TABS: 1.0000 mg | ORAL_TABLET | Freq: Two times a day (BID) | ORAL | Status: DC

## 2014-08-08 NOTE — Progress Notes (Signed)
[place] Progress Note  Jackie Goodman [id number] 58 y.o.  08/08/2014 11:47 AM  Chief Complaint:  Medication management and followup.  History of Present Illness:  Jackie Goodman came for her followup appointment.  She is compliant with her Xanax and Wellbutrin.  She had a good Thanksgiving.  She denies any crying spells, irritability or any anger.  Her sleep is good.  She enjoys her great grandson who is now 61-month-old.  She is planning to spend Christmas with her family member.  Recently she's seen her nephrologist and have creatinine was 1.5.  Her sleep is good.  Her appetite is okay.  Her vitals are stable.  She denies drinking or using any illegal substances.  Suicidal Ideation: No Plan Formed: No Patient has means to carry out plan: No  Homicidal Ideation: No Plan Formed: No Patient has means to carry out plan: No  Medical History; Patient has lupus, chronic renal insufficiency, myositis, migraine headaches, chronic pain.  Most of her physicians are at [facility] .  Her rheumatologist is Dr. [name], her nephrologist is Dr [name] and her primary care physician is Dr. [name] in [place].    Past Psychiatric History/Hospitalization(s) Patient has depression since 1993 when she was diagnosed with lupus.  She was seeing psychiatrist at [facility] clinic however she was not happy and decided to change her psychiatrist.  She is seen in this office since April 2009.  In the past she has taken Zoloft.  Patient denies any inpatient psychiatric treatment, paranoia, hallucination or any suicidal thoughts. Anxiety: Yes Bipolar Disorder: No Depression: No Mania: No Psychosis: No Schizophrenia: No Personality Disorder: No Hospitalization for psychiatric illness: No History of Electroconvulsive Shock Therapy: No Prior Suicide Attempts: No   Review of Systems: Psychiatric: Agitation: No Hallucination: No Depressed Mood: No Insomnia: No Hypersomnia: No Altered  Concentration: No Feels Worthless: No Grandiose Ideas: No Belief In Special Powers: No New/Increased Substance Abuse: No Compulsions: No  Neurologic: Headache: No Seizure: No Paresthesias: No    Outpatient Encounter Prescriptions as of 08/08/2014  Medication Sig  . ALPRAZolam (XANAX) 1 MG tablet Take 1 tablet (1 mg total) by mouth 2 (two) times daily.  Marland Kitchen buPROPion (WELLBUTRIN XL) 150 MG 24 hr tablet Take 1 tablet (150 mg total) by mouth daily.  . clobetasol (TEMOVATE) 0.05 % external solution Apply 1 application topically 2 (two) times daily.   . cyclobenzaprine (FLEXERIL) 5 MG tablet Take 5 mg by mouth 3 (three) times daily as needed for muscle spasms.  . fluticasone (FLONASE) 50 MCG/ACT nasal spray Place 1 spray into the nose daily.   . hydroxychloroquine (PLAQUENIL) 200 MG tablet Take 200 mg by mouth 2 (two) times daily.   . metroNIDAZOLE (METROGEL) 0.75 % gel   . omeprazole (PRILOSEC) 20 MG capsule Take 20 mg by mouth 2 (two) times daily before a meal.  . [DISCONTINUED] ALPRAZolam (XANAX) 1 MG tablet Take 1 tablet (1 mg total) by mouth 2 (two) times daily.  . [DISCONTINUED] buPROPion (WELLBUTRIN XL) 150 MG 24 hr tablet Take 1 tablet (150 mg total) by mouth daily.    No results found for this or any previous visit (from the past 2160 hour(s)).   Physical Exam: Constitutional:  BP 148/90 mmHg  Pulse 50  Ht 5\' 5"  (1.651 m)  Wt 160 lb 9.6 oz (72.848 kg)  BMI 26.73 kg/m2  Musculoskeletal: Strength & Muscle Tone: within normal limits Gait & Station: normal Patient leans: N/A  Mental Status Examination;  Patient is casually  dressed and fairly groomed.  She is cooperative and maintained good eye contact. Her speech is slow and soft but clear and coherent. Her thought processes logical and goal-directed. She has no flight of ideas or loose association. Her fund of knowledge is adequate. She denies any active or passive suicidal thoughts or homicidal thoughts and she denies any  auditory or visual hallucination.  There were no delusions , paranoia or obsession .  She's alert and oriented x3. Her insight judgment and impulse control is okay.  Established Problem, Stable/Improving (1), Review of Last Therapy Session (1) and Review of Medication Regimen & Side Effects (2)  Assessment: Axis I: Depressive disorder NOS, mood disorder due to general medical condition  Axis II: Deferred  Axis III:  Past Medical History  Diagnosis Date  . Lupus   . Myositis   . CRI (chronic renal insufficiency)   . Rosacea   . Headache(784.0)   . HTN (hypertension)     Axis IV: Mild to moderate   Plan:  Patient is fairly stable on her current medication.  I will continue Wellbutrin XL 150 mg daily and Xanax 1 mg twice a day.  Discussed benzodiazepine dependence, tolerance and withdrawal symptoms.  Patient does not ask for early refills.  Recommended to call us back if she has any question or any concern.  I will see her again in 3 months.  ARFEEN,SYED T., MD 08/08/2014

## 2014-11-07 ENCOUNTER — Ambulatory Visit (INDEPENDENT_AMBULATORY_CARE_PROVIDER_SITE_OTHER): Payer: Medicare Other | Admitting: Psychiatry

## 2014-11-07 ENCOUNTER — Encounter (HOSPITAL_COMMUNITY): Payer: Self-pay | Admitting: Psychiatry

## 2014-11-07 VITALS — BP 156/91 | HR 58 | Ht 65.0 in | Wt 163.0 lb

## 2014-11-07 DIAGNOSIS — F32A Depression, unspecified: Secondary | ICD-10-CM

## 2014-11-07 DIAGNOSIS — F329 Major depressive disorder, single episode, unspecified: Secondary | ICD-10-CM | POA: Diagnosis not present

## 2014-11-07 DIAGNOSIS — F0630 Mood disorder due to known physiological condition, unspecified: Secondary | ICD-10-CM | POA: Diagnosis not present

## 2014-11-07 MED ORDER — BUPROPION HCL ER (XL) 150 MG PO TB24: 150.0000 mg | ORAL_TABLET | Freq: Every day | ORAL | Status: DC

## 2014-11-07 MED ORDER — ALPRAZOLAM 1 MG PO TABS: 1.0000 mg | ORAL_TABLET | Freq: Two times a day (BID) | ORAL | Status: DC

## 2014-11-07 NOTE — Progress Notes (Signed)
[place] Progress Note  Jackie Goodman [id number] 58 y.o.  11/07/2014 11:53 AM  Chief Complaint:  Medication management and followup.  History of Present Illness:  Jackie Goodman came for her followup appointment.  She is taking her medication as prescribed.  She denies any irritability, anger, mood swing.  Her depression is under control.  She continues to enjoy the company offer her grandson.  She sleeping good.  She is scheduled to see her nephrologist tomorrow morning .  Patient denies any paranoia or any hallucination.  She denies drinking or using any illegal substances.  She is compliant with Wellbutrin and Xanax and does not ask for early refills.  Her appetite is okay.  Her vitals are stable.  Suicidal Ideation: No Plan Formed: No Patient has means to carry out plan: No  Homicidal Ideation: No Plan Formed: No Patient has means to carry out plan: No  Medical History; Patient has lupus, chronic renal insufficiency, myositis, migraine headaches, chronic pain.  Most of her physicians are at [facility] .  Her rheumatologist is Dr. [name], her nephrologist is Dr [name] and her primary care physician is Dr. [name] in [place].    Past Psychiatric History/Hospitalization(s) Patient has depression since 1993 when she was diagnosed with lupus.  She was seeing psychiatrist at [facility] clinic however she was not happy and decided to change her psychiatrist.  She is seen in this office since April 2009.  In the past she has taken Zoloft.  Patient denies any inpatient psychiatric treatment, paranoia, hallucination or any suicidal thoughts. Anxiety: Yes Bipolar Disorder: No Depression: No Mania: No Psychosis: No Schizophrenia: No Personality Disorder: No Hospitalization for psychiatric illness: No History of Electroconvulsive Shock Therapy: No Prior Suicide Attempts: No   Review of Systems: Psychiatric: Agitation: No Hallucination: No Depressed Mood: No Insomnia:  No Hypersomnia: No Altered Concentration: No Feels Worthless: No Grandiose Ideas: No Belief In Special Powers: No New/Increased Substance Abuse: No Compulsions: No  Neurologic: Headache: No Seizure: No Paresthesias: No    Outpatient Encounter Prescriptions as of 11/07/2014  Medication Sig  . ALPRAZolam (XANAX) 1 MG tablet Take 1 tablet (1 mg total) by mouth 2 (two) times daily.  Marland Kitchen buPROPion (WELLBUTRIN XL) 150 MG 24 hr tablet Take 1 tablet (150 mg total) by mouth daily.  . clobetasol (TEMOVATE) 0.05 % external solution Apply 1 application topically 2 (two) times daily.   . cyclobenzaprine (FLEXERIL) 5 MG tablet Take 5 mg by mouth 3 (three) times daily as needed for muscle spasms.  . fluticasone (FLONASE) 50 MCG/ACT nasal spray Place 1 spray into the nose daily.   . hydroxychloroquine (PLAQUENIL) 200 MG tablet Take 200 mg by mouth 2 (two) times daily.   Marland Kitchen omeprazole (PRILOSEC) 20 MG capsule Take 20 mg by mouth 2 (two) times daily before a meal.  . [DISCONTINUED] ALPRAZolam (XANAX) 1 MG tablet Take 1 tablet (1 mg total) by mouth 2 (two) times daily.  . [DISCONTINUED] buPROPion (WELLBUTRIN XL) 150 MG 24 hr tablet Take 1 tablet (150 mg total) by mouth daily.  . [DISCONTINUED] metroNIDAZOLE (METROGEL) 0.75 % gel     No results found for this or any previous visit (from the past 2160 hour(s)).   Physical Exam: Constitutional:  BP 156/91 mmHg  Pulse 58  Ht 5\' 5"  (1.651 m)  Wt 163 lb (73.936 kg)  BMI 27.12 kg/m2  Musculoskeletal: Strength & Muscle Tone: within normal limits Gait & Station: normal Patient leans: N/A  Mental Status Examination;  Patient  is casually dressed and fairly groomed.  She is cooperative and maintained good eye contact. Her speech is slow and soft but clear and coherent. Her thought processes logical and goal-directed. She has no flight of ideas or loose association. Her fund of knowledge is adequate. She denies any active or passive suicidal thoughts or  homicidal thoughts and she denies any auditory or visual hallucination.  There were no delusions , paranoia or obsession .  She's alert and oriented x3. Her insight judgment and impulse control is okay.  Established Problem, Stable/Improving (1), Review of Last Therapy Session (1) and Review of Medication Regimen & Side Effects (2)  Assessment: Axis I: Depressive disorder NOS, mood disorder due to general medical condition  Axis II: Deferred  Axis III:  Past Medical History  Diagnosis Date  . Lupus   . Myositis   . CRI (chronic renal insufficiency)   . Rosacea   . Headache(784.0)   . HTN (hypertension)    Plan:  Patient is stable on her current medication.  I will continue Wellbutrin XL 150 mg daily and Xanax 1 mg twice a day.  Discussed benzodiazepine dependence, tolerance and withdrawal symptoms.  Patient does not ask for early refills.  Recommended to call us back if she has any question or any concern.  I will see her again in 3 months.  Tyrihanna Wingert T., MD 11/07/2014

## 2014-11-08 DIAGNOSIS — I10 Essential (primary) hypertension: Secondary | ICD-10-CM | POA: Diagnosis not present

## 2014-11-08 DIAGNOSIS — N183 Chronic kidney disease, stage 3 (moderate): Secondary | ICD-10-CM | POA: Diagnosis not present

## 2015-02-07 ENCOUNTER — Ambulatory Visit (INDEPENDENT_AMBULATORY_CARE_PROVIDER_SITE_OTHER): Payer: Medicare Other | Admitting: Psychiatry

## 2015-02-07 ENCOUNTER — Encounter (HOSPITAL_COMMUNITY): Payer: Self-pay | Admitting: Psychiatry

## 2015-02-07 VITALS — BP 162/98 | HR 56 | Ht 65.0 in | Wt 160.8 lb

## 2015-02-07 DIAGNOSIS — F32A Depression, unspecified: Secondary | ICD-10-CM

## 2015-02-07 DIAGNOSIS — E559 Vitamin D deficiency, unspecified: Secondary | ICD-10-CM | POA: Diagnosis not present

## 2015-02-07 DIAGNOSIS — F329 Major depressive disorder, single episode, unspecified: Secondary | ICD-10-CM | POA: Diagnosis not present

## 2015-02-07 DIAGNOSIS — M329 Systemic lupus erythematosus, unspecified: Secondary | ICD-10-CM | POA: Diagnosis not present

## 2015-02-07 DIAGNOSIS — F39 Unspecified mood [affective] disorder: Secondary | ICD-10-CM | POA: Diagnosis not present

## 2015-02-07 DIAGNOSIS — R911 Solitary pulmonary nodule: Secondary | ICD-10-CM | POA: Diagnosis not present

## 2015-02-07 DIAGNOSIS — M3320 Polymyositis, organ involvement unspecified: Secondary | ICD-10-CM | POA: Diagnosis not present

## 2015-02-07 MED ORDER — BUPROPION HCL ER (XL) 150 MG PO TB24: 150.0000 mg | ORAL_TABLET | Freq: Every day | ORAL | Status: DC

## 2015-02-07 MED ORDER — ALPRAZOLAM 1 MG PO TABS: 1.0000 mg | ORAL_TABLET | Freq: Two times a day (BID) | ORAL | Status: DC

## 2015-02-07 NOTE — Progress Notes (Signed)
[place] Progress Note  Jackie Goodman [id number] 59 y.o.  02/07/2015 10:52 AM  Chief Complaint:  Medication management and followup.  History of Present Illness:  Jackie Goodman came for her followup appointment.  Today she is complaining of laryngitis and chronic pain.  She was given Percocet and tramadol but she is taking as needed.  She is taking her Wellbutrin and Xanax as prescribed.  Overall she feels her depression and anxiety is under control.  Today her blood pressure is slightly increased but she has no chest pain, headaches or any dizziness.  She's not sure what causing her blood pressure.  She scheduled to see her primary care physician and she will discuss with it.  She bring blood work results which was done on March 10 at [facility].  Her creatinine is 1.3.  Her abdomen is 3.2.  Her court reporting is 647.  Patient denies any irritability, anger, mood swing.  Her sleep is good.  She continues to engage in her family very well.  She had a good supportive family.  She has no tremors or shakes.  Her appetite is okay.  She is not drinking alcohol or using any illegal substances.  Suicidal Ideation: No Plan Formed: No Patient has means to carry out plan: No  Homicidal Ideation: No Plan Formed: No Patient has means to carry out plan: No  Medical History; Patient has lupus, chronic renal insufficiency, myositis, migraine headaches, chronic pain.  Most of her physicians are at [facility] .  Her rheumatologist is Dr. [name], her nephrologist is Dr [name] and her primary care physician is Dr. [name] in [place].    Past Psychiatric History/Hospitalization(s) Patient has depression since 1993 when she was diagnosed with lupus.  She was seeing psychiatrist at [facility] clinic however she was not happy and decided to change her psychiatrist.  She is seen in this office since April 2009.  In the past she has taken Zoloft.  Patient denies any inpatient psychiatric treatment, paranoia,  hallucination or any suicidal thoughts. Anxiety: Yes Bipolar Disorder: No Depression: No Mania: No Psychosis: No Schizophrenia: No Personality Disorder: No Hospitalization for psychiatric illness: No History of Electroconvulsive Shock Therapy: No Prior Suicide Attempts: No   Review of Systems  Cardiovascular: Negative for chest pain and palpitations.  Musculoskeletal: Positive for back pain and joint pain.  Skin: Negative for itching and rash.  Neurological: Negative for dizziness, tremors and headaches.  Psychiatric/Behavioral: Negative for depression, suicidal ideas and substance abuse.    Psychiatric: Agitation: No Hallucination: No Depressed Mood: No Insomnia: No Hypersomnia: No Altered Concentration: No Feels Worthless: No Grandiose Ideas: No Belief In Special Powers: No New/Increased Substance Abuse: No Compulsions: No  Neurologic: Headache: No Seizure: No Paresthesias: No    Outpatient Encounter Prescriptions as of 02/07/2015  Medication Sig  . ALPRAZolam (XANAX) 1 MG tablet Take 1 tablet (1 mg total) by mouth 2 (two) times daily.  Marland Kitchen buPROPion (WELLBUTRIN XL) 150 MG 24 hr tablet Take 1 tablet (150 mg total) by mouth daily.  . clobetasol (TEMOVATE) 0.05 % external solution Apply 1 application topically 2 (two) times daily.   . cyclobenzaprine (FLEXERIL) 5 MG tablet Take 5 mg by mouth 3 (three) times daily as needed for muscle spasms.  . fluticasone (FLONASE) 50 MCG/ACT nasal spray Place 1 spray into the nose daily.   . hydroxychloroquine (PLAQUENIL) 200 MG tablet Take 200 mg by mouth 2 (two) times daily.   Marland Kitchen omeprazole (PRILOSEC) 20 MG capsule Take 20 mg  by mouth 2 (two) times daily before a meal.  . oxyCODONE-acetaminophen (PERCOCET) 10-325 MG per tablet Take 1 tablet by mouth 2 (two) times daily.  . traMADol (ULTRAM) 50 MG tablet take 1 tablet by mouth every 8 hours if needed for pain  . [DISCONTINUED] ALPRAZolam (XANAX) 1 MG tablet Take 1 tablet (1 mg total)  by mouth 2 (two) times daily.  . [DISCONTINUED] buPROPion (WELLBUTRIN XL) 150 MG 24 hr tablet Take 1 tablet (150 mg total) by mouth daily.   No facility-administered encounter medications on file as of 02/07/2015.    No results found for this or any previous visit (from the past 2160 hour(s)).   Physical Exam: Constitutional:  BP 162/98 mmHg  Pulse 56  Ht 5\' 5"  (1.651 m)  Wt 160 lb 12.8 oz (72.938 kg)  BMI 26.76 kg/m2  Musculoskeletal: Strength & Muscle Tone: within normal limits Gait & Station: normal Patient leans: N/A  Mental Status Examination;  Patient is casually dressed and fairly groomed.  She is pleasant and cooperative.  Due to laryngitis her speech is slow and she has hoarseness .  However her tone and volume is normal.  Her thought processes logical and goal-directed. She has no flight of ideas or loose association. Her fund of knowledge is adequate. She denies any active or passive suicidal thoughts or homicidal thoughts and she denies any auditory or visual hallucination.  There were no delusions , paranoia or obsession .  She's alert and oriented x3. Her insight judgment and impulse control is okay.  Established Problem, Stable/Improving (1), Review of Last Therapy Session (1) and Review of Medication Regimen & Side Effects (2)  Assessment: Axis I: Depressive disorder NOS, mood disorder due to general medical condition  Axis II: Deferred  Axis III:  Past Medical History  Diagnosis Date  . Lupus   . Myositis   . CRI (chronic renal insufficiency)   . Rosacea   . Headache(784.0)   . HTN (hypertension)    Plan:  Patient is a stable on her current medication.  However I discuss increase blood pressure reading and recommended to see her primary care physician if symptoms started to get worse .  Patient is aware and she will contact her primary care physician if she has any headaches or chest pain.  I also talk about use of narcotic pain medication with Xanax cause  interaction and patient is only taking pain medication as needed for severe pain.  I will continue Wellbutrin XL 150 mg daily and Xanax 1 mg twice a day.  Discussed benzodiazepine dependence, tolerance and withdrawal symptoms.  Patient does not ask for early refills.  Recommended to call us back if she has any question or any concern.  I will see her again in 3 months.  Kamea Dacosta T., MD 02/07/2015

## 2015-04-29 ENCOUNTER — Other Ambulatory Visit: Payer: Self-pay | Admitting: Oncology

## 2015-05-14 ENCOUNTER — Encounter (HOSPITAL_COMMUNITY): Payer: Self-pay | Admitting: Psychiatry

## 2015-05-14 ENCOUNTER — Ambulatory Visit (INDEPENDENT_AMBULATORY_CARE_PROVIDER_SITE_OTHER): Payer: Medicare Other | Admitting: Psychiatry

## 2015-05-14 VITALS — BP 135/92 | HR 71 | Ht 65.0 in | Wt 156.6 lb

## 2015-05-14 DIAGNOSIS — F329 Major depressive disorder, single episode, unspecified: Secondary | ICD-10-CM | POA: Diagnosis not present

## 2015-05-14 DIAGNOSIS — F39 Unspecified mood [affective] disorder: Secondary | ICD-10-CM

## 2015-05-14 DIAGNOSIS — F32A Depression, unspecified: Secondary | ICD-10-CM

## 2015-05-14 MED ORDER — ALPRAZOLAM 1 MG PO TABS: 1.0000 mg | ORAL_TABLET | Freq: Two times a day (BID) | ORAL | Status: DC

## 2015-05-14 MED ORDER — BUPROPION HCL ER (XL) 150 MG PO TB24: 150.0000 mg | ORAL_TABLET | Freq: Every day | ORAL | Status: DC

## 2015-05-14 NOTE — Progress Notes (Signed)
[place] Progress Note  Jackie Goodman [id number] 59 y.o.  05/14/2015 11:10 AM  Chief Complaint:  Medication management and followup.  History of Present Illness:  Jackie Goodman came for her followup appointment.  She is compliant with her medication.  She denies any side effects.  She saw her specialist at [facility] and there were no major changes in her medication.  She is no longer taking Percocet pain medication.  Her last creatinine was 1.5.  Overall she described her mood has been good.  She is keeping her 6-month-old grandchild 5 days a week and she is happy about it.  She denies any major panic attack.  She denies any crying spells irritability or any severe mood swings.  She had a good support from her family member.  Patient has no issues with the medication.  Her appetite is okay.  Her vitals are stable.  Patient denies drinking or using any illegal substances.  She sleeping good and denies any feeling of hopelessness or worthlessness.  She wants to continue her current psycho topic medication.  Suicidal Ideation: No Plan Formed: No Patient has means to carry out plan: No  Homicidal Ideation: No Plan Formed: No Patient has means to carry out plan: No  Medical History; Patient has lupus, chronic renal insufficiency, myositis, migraine headaches, chronic pain.  Most of her physicians are at [facility] .  Her rheumatologist is Dr. [name], her nephrologist is Dr [name] and her primary care physician is Dr. [name] in [place].    Past Psychiatric History/Hospitalization(s) Patient has depression since 1993 when she was diagnosed with lupus.  She was seeing psychiatrist at [facility] clinic however she was not happy and decided to change her psychiatrist.  She is seen in this office since April 2009.  In the past she has taken Zoloft.  Patient denies any inpatient psychiatric treatment, paranoia, hallucination or any suicidal thoughts. Anxiety: Yes Bipolar Disorder:  No Depression: No Mania: No Psychosis: No Schizophrenia: No Personality Disorder: No Hospitalization for psychiatric illness: No History of Electroconvulsive Shock Therapy: No Prior Suicide Attempts: No   Review of Systems  Cardiovascular: Negative for chest pain and palpitations.  Musculoskeletal: Positive for back pain and joint pain.  Skin: Negative for itching and rash.  Neurological: Negative for dizziness, tremors and headaches.  Psychiatric/Behavioral: Negative for depression, suicidal ideas and substance abuse.    Psychiatric: Agitation: No Hallucination: No Depressed Mood: No Insomnia: No Hypersomnia: No Altered Concentration: No Feels Worthless: No Grandiose Ideas: No Belief In Special Powers: No New/Increased Substance Abuse: No Compulsions: No  Neurologic: Headache: No Seizure: No Paresthesias: No    Outpatient Encounter Prescriptions as of 05/14/2015  Medication Sig  . ALPRAZolam (XANAX) 1 MG tablet Take 1 tablet (1 mg total) by mouth 2 (two) times daily.  Marland Kitchen buPROPion (WELLBUTRIN XL) 150 MG 24 hr tablet Take 1 tablet (150 mg total) by mouth daily.  . clobetasol (TEMOVATE) 0.05 % external solution Apply 1 application topically 2 (two) times daily.   . cyclobenzaprine (FLEXERIL) 5 MG tablet Take 5 mg by mouth 3 (three) times daily as needed for muscle spasms.  . fluticasone (FLONASE) 50 MCG/ACT nasal spray Place 1 spray into the nose daily.   . hydroxychloroquine (PLAQUENIL) 200 MG tablet Take 200 mg by mouth 2 (two) times daily.   Marland Kitchen omeprazole (PRILOSEC) 20 MG capsule Take 20 mg by mouth 2 (two) times daily before a meal.  . RA CALCIUM 600/VITAMIN D-3 600-400 MG-UNIT TABS Take 1 tablet  by mouth 2 (two) times daily with a meal.  . traMADol (ULTRAM) 50 MG tablet take 1 tablet by mouth every 8 hours if needed for pain  . [DISCONTINUED] ALPRAZolam (XANAX) 1 MG tablet Take 1 tablet (1 mg total) by mouth 2 (two) times daily.  . [DISCONTINUED] buPROPion  (WELLBUTRIN XL) 150 MG 24 hr tablet Take 1 tablet (150 mg total) by mouth daily.  . [DISCONTINUED] oxyCODONE-acetaminophen (PERCOCET) 10-325 MG per tablet Take 1 tablet by mouth 2 (two) times daily.   No facility-administered encounter medications on file as of 05/14/2015.    No results found for this or any previous visit (from the past 2160 hour(s)).   Physical Exam: Constitutional:  BP 135/92 mmHg  Pulse 71  Ht 5\' 5"  (1.651 m)  Wt 156 lb 9.6 oz (71.033 kg)  BMI 26.06 kg/m2  Musculoskeletal: Strength & Muscle Tone: within normal limits Gait & Station: normal Patient leans: N/A  Mental Status Examination;  Patient is casually dressed and fairly groomed.  She is pleasant and cooperative.  She maintained good eye contact.  Her speech is slow but clear and coherent. Her thought processes logical and goal-directed. She has no flight of ideas or loose association. Her fund of knowledge is adequate. She denies any active or passive suicidal thoughts or homicidal thoughts and she denies any auditory or visual hallucination.  There were no delusions , paranoia or obsession .  She's alert and oriented x3. Her insight judgment and impulse control is okay.  Established Problem, Stable/Improving (1), Review or order clinical lab tests (1), Review of Last Therapy Session (1) and Review of Medication Regimen & Side Effects (2)  Assessment: Axis I: Depressive disorder NOS, mood disorder due to general medical condition  Axis II: Deferred  Axis III:  Past Medical History  Diagnosis Date  . Lupus   . Myositis   . CRI (chronic renal insufficiency)   . Rosacea   . Headache(784.0)   . HTN (hypertension)    Plan:  Patient is a stable on her current medication.  I review her last blood work.  Her creatinine is 1.5 which was done in June 10.  She is no longer taking Percocet .  I will continue Wellbutrin XL 150 mg daily and Xanax 1 mg twice a day.  Patient does not asked for early refills of  benzodiazepine.  Discussed medication side effects and benefits.  Recommended to call us back if she has any question or any concern.  Follow-up in 3 months.   Kaiyden Simkin T., MD 05/14/2015

## 2015-05-17 IMAGING — CR DG KNEE COMPLETE 4+V*R*
4 series · 4 of 4 positions shown · non-contrast
Comparison: 05/28/2010

CLINICAL DATA: Fall, knee pain, swelling

EXAM:
RIGHT KNEE - COMPLETE 4+ VIEW

[t knee ap right]
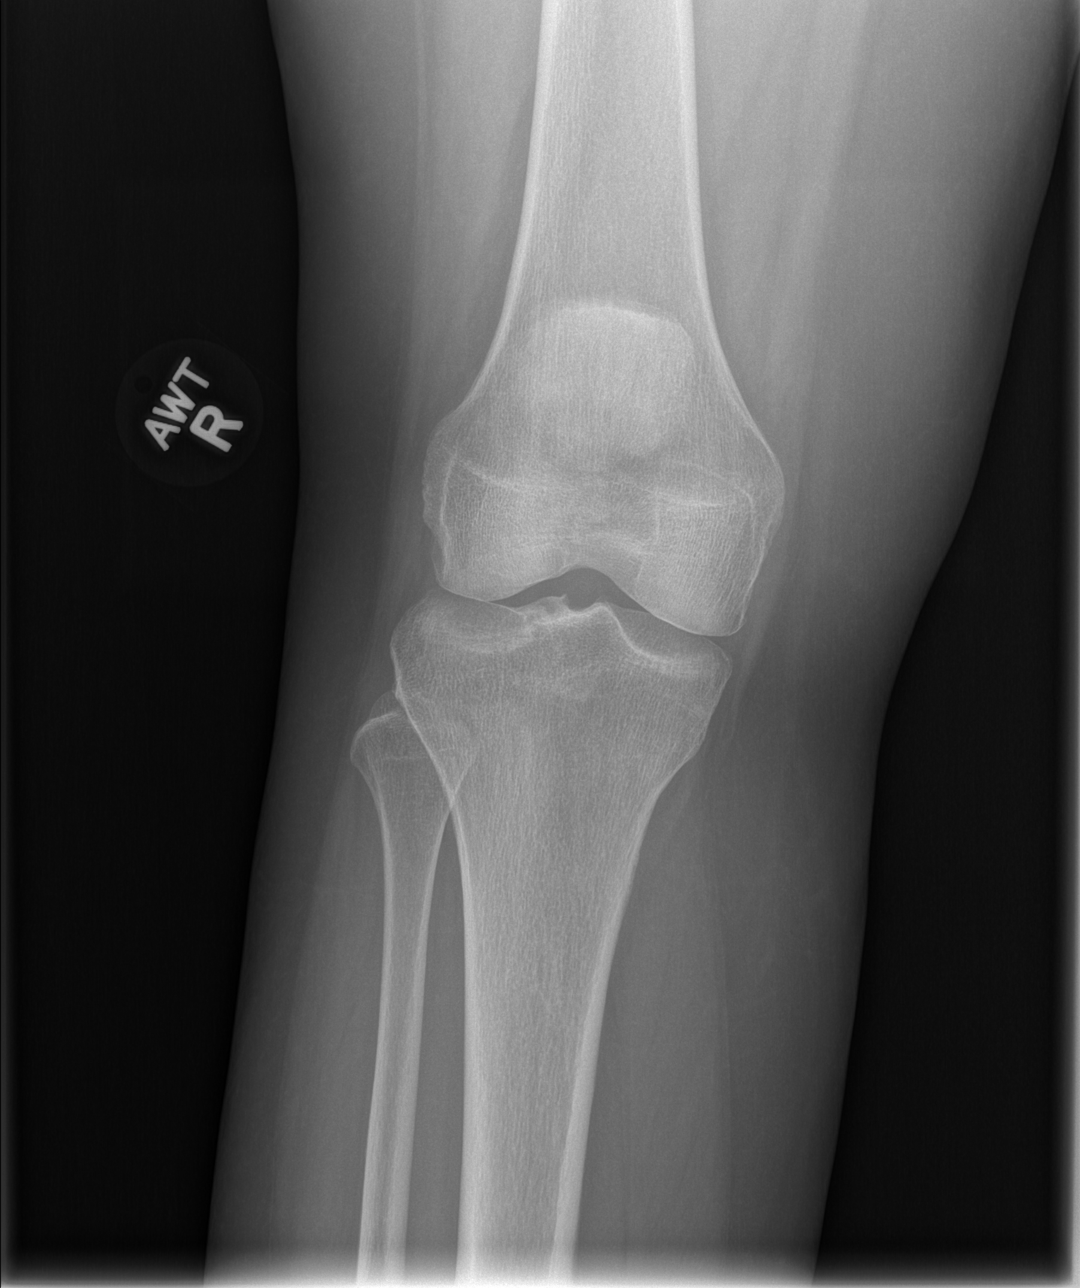

[t knee obl right (1 of 2)]
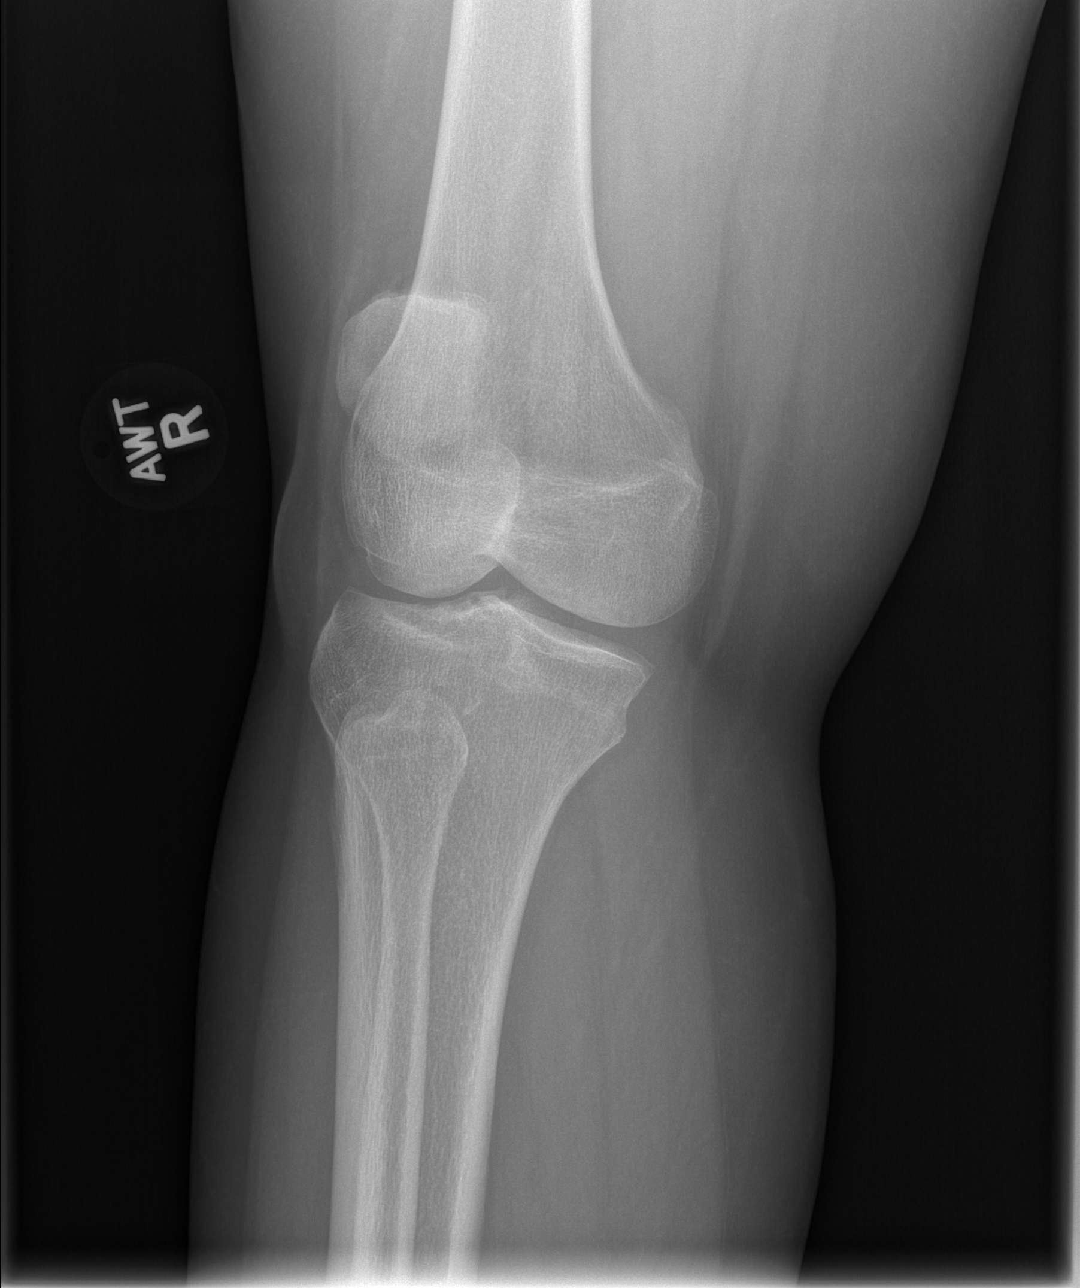

[t knee obl right (2 of 2)]
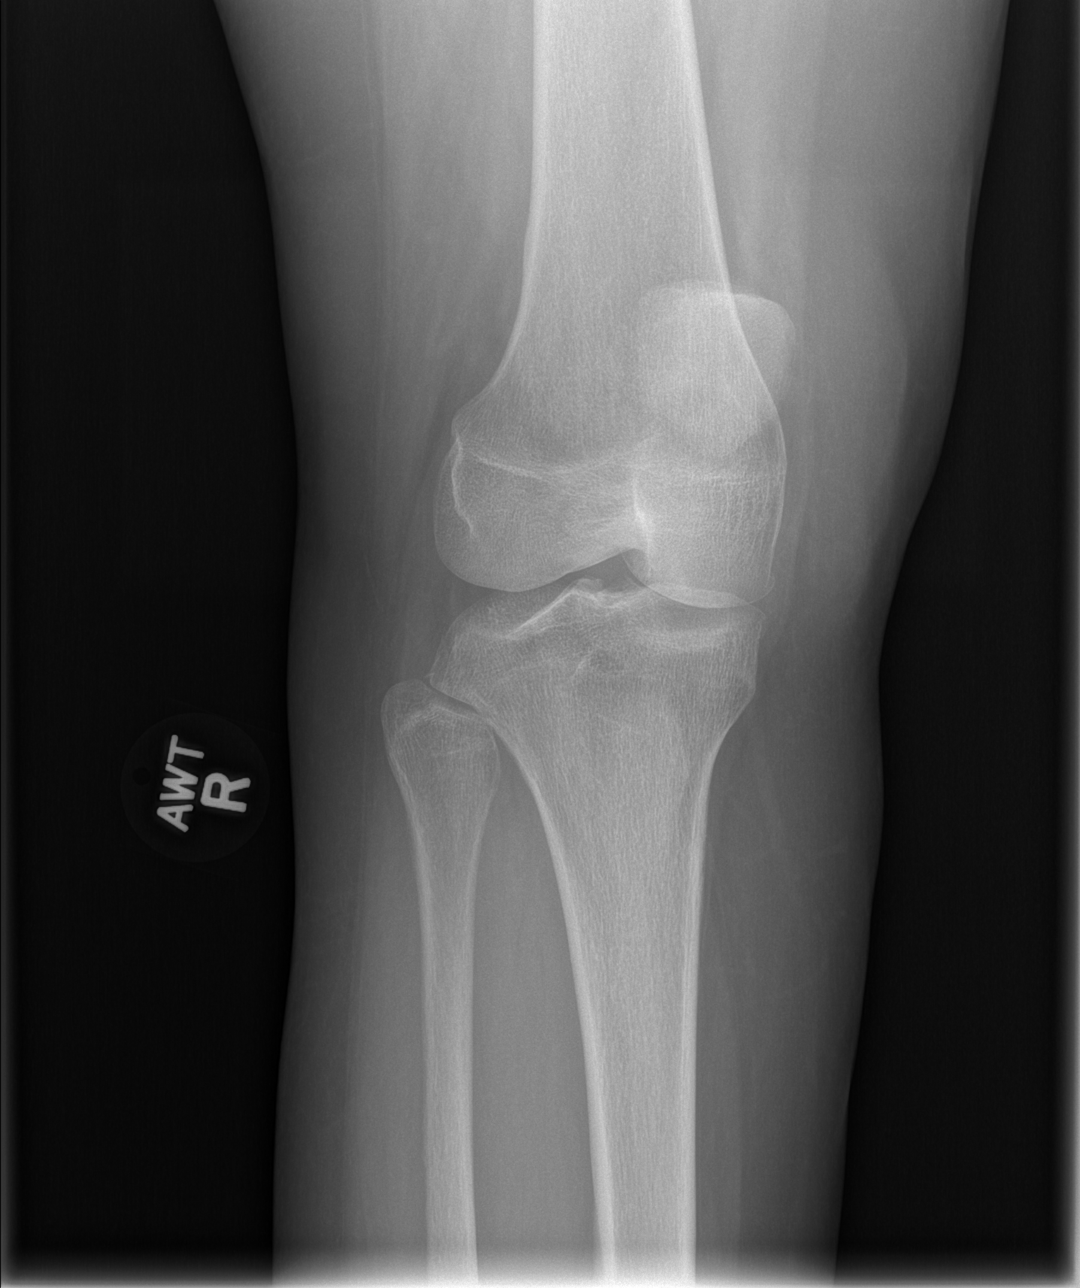

[x knee lat right]
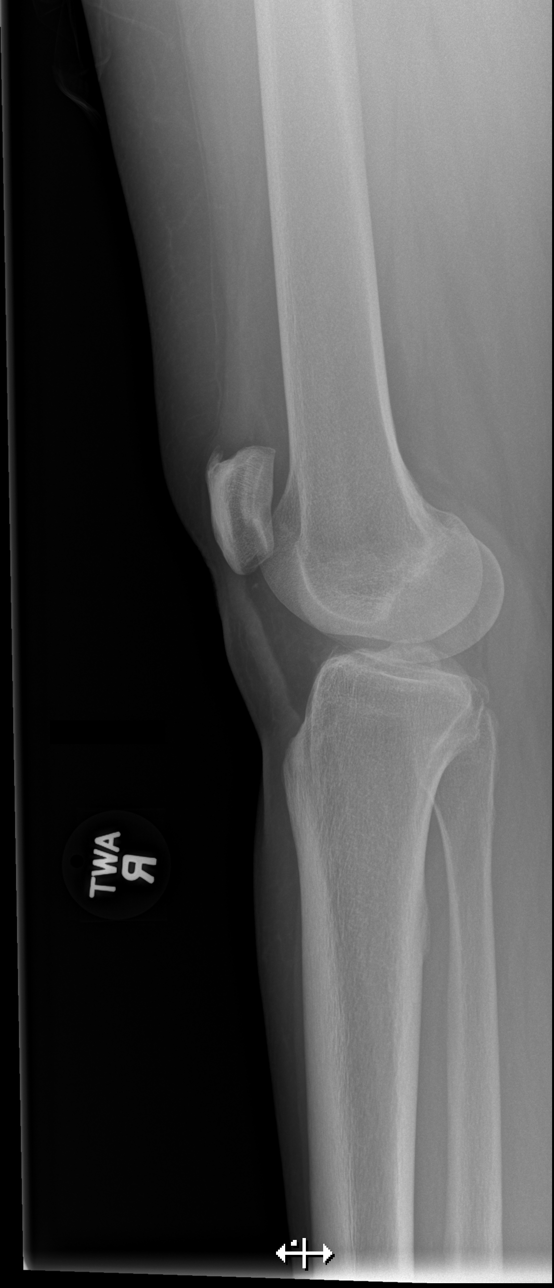

[4 of 4 positions shown; findings below may reference images not displayed]

FINDINGS: There is no evidence of fracture, dislocation, or joint effusion.
There is no evidence of arthropathy or other focal bone abnormality.
Soft tissues are unremarkable.
IMPRESSION: No acute osseous finding

## 2015-05-20 DIAGNOSIS — Z79899 Other long term (current) drug therapy: Secondary | ICD-10-CM | POA: Diagnosis not present

## 2015-05-20 DIAGNOSIS — H25813 Combined forms of age-related cataract, bilateral: Secondary | ICD-10-CM | POA: Diagnosis not present

## 2015-06-20 DIAGNOSIS — M3214 Glomerular disease in systemic lupus erythematosus: Secondary | ICD-10-CM | POA: Diagnosis not present

## 2015-06-20 DIAGNOSIS — Z23 Encounter for immunization: Secondary | ICD-10-CM | POA: Diagnosis not present

## 2015-06-20 DIAGNOSIS — I129 Hypertensive chronic kidney disease with stage 1 through stage 4 chronic kidney disease, or unspecified chronic kidney disease: Secondary | ICD-10-CM | POA: Diagnosis not present

## 2015-06-20 DIAGNOSIS — N183 Chronic kidney disease, stage 3 (moderate): Secondary | ICD-10-CM | POA: Diagnosis not present

## 2015-06-20 DIAGNOSIS — J301 Allergic rhinitis due to pollen: Secondary | ICD-10-CM | POA: Diagnosis not present

## 2015-06-20 DIAGNOSIS — Z79899 Other long term (current) drug therapy: Secondary | ICD-10-CM | POA: Diagnosis not present

## 2015-06-20 DIAGNOSIS — I10 Essential (primary) hypertension: Secondary | ICD-10-CM | POA: Diagnosis not present

## 2015-06-20 DIAGNOSIS — R809 Proteinuria, unspecified: Secondary | ICD-10-CM | POA: Diagnosis not present

## 2015-08-13 ENCOUNTER — Encounter (HOSPITAL_COMMUNITY): Payer: Self-pay | Admitting: Psychiatry

## 2015-08-13 ENCOUNTER — Ambulatory Visit (INDEPENDENT_AMBULATORY_CARE_PROVIDER_SITE_OTHER): Payer: Medicare Other | Admitting: Psychiatry

## 2015-08-13 VITALS — BP 124/76 | HR 60 | Ht 65.0 in | Wt 154.2 lb

## 2015-08-13 DIAGNOSIS — R809 Proteinuria, unspecified: Secondary | ICD-10-CM | POA: Insufficient documentation

## 2015-08-13 DIAGNOSIS — M3320 Polymyositis, organ involvement unspecified: Secondary | ICD-10-CM | POA: Insufficient documentation

## 2015-08-13 DIAGNOSIS — F329 Major depressive disorder, single episode, unspecified: Secondary | ICD-10-CM

## 2015-08-13 DIAGNOSIS — F32A Depression, unspecified: Secondary | ICD-10-CM

## 2015-08-13 MED ORDER — ALPRAZOLAM 1 MG PO TABS: 1.0000 mg | ORAL_TABLET | Freq: Two times a day (BID) | ORAL | Status: DC

## 2015-08-13 MED ORDER — BUPROPION HCL ER (XL) 150 MG PO TB24: 150.0000 mg | ORAL_TABLET | Freq: Every day | ORAL | Status: DC

## 2015-08-13 NOTE — Progress Notes (Signed)
[place] Progress Note  Cornie Bosch [id number] 59 y.o.  08/13/2015 10:53 AM  Chief Complaint:  Medication management and followup.  History of Present Illness:  [name] came for her followup appointment.  She had a good Thanksgiving with her family .  She cooks the food and she feels proud of it.  Recently she's seen her specialist at [facility] and there has been no changes.  Her creatinine is 1.4 and her glucose is 65.  She is no longer taking any pain medication.  She denies any irritability, anger, mood swing.  She denies any crying spells or any feeling of hopelessness or worthlessness.  She is taking Wellbutrin and Xanax as prescribed.  She denies drinking or using any illegal substances.  Her sleep is good.  She is excited about Christmas .  She has no tremors shakes or any side effects.  Her appetite is okay.  Her vitals are stable.  She wants to continue her current psycho topic medication.  Suicidal Ideation: No Plan Formed: No Patient has means to carry out plan: No  Homicidal Ideation: No Plan Formed: No Patient has means to carry out plan: No  Medical History; Patient has lupus, chronic renal insufficiency, myositis, migraine headaches, chronic pain.  Most of her physicians are at [facility] .  Her rheumatologist is Dr. [name], her nephrologist is Dr [name] and her primary care physician is Dr. [name] in [place].    Past Psychiatric History/Hospitalization(s) Patient has depression since 1993 when she was diagnosed with lupus.  She was seeing psychiatrist at [facility] clinic however she was not happy and decided to change her psychiatrist.  She is seen in this office since April 2009.  In the past she has taken Zoloft.  Patient denies any inpatient psychiatric treatment, paranoia, hallucination or any suicidal thoughts. Anxiety: Yes Bipolar Disorder: No Depression: No Mania: No Psychosis: No Schizophrenia: No Personality Disorder: No Hospitalization for  psychiatric illness: No History of Electroconvulsive Shock Therapy: No Prior Suicide Attempts: No   Review of Systems  Cardiovascular: Negative for chest pain and palpitations.  Musculoskeletal: Positive for back pain and joint pain.  Skin: Negative for itching and rash.  Neurological: Negative for dizziness, tremors and headaches.  Psychiatric/Behavioral: Negative for depression, suicidal ideas and substance abuse.    Psychiatric: Agitation: No Hallucination: No Depressed Mood: No Insomnia: No Hypersomnia: No Altered Concentration: No Feels Worthless: No Grandiose Ideas: No Belief In Special Powers: No New/Increased Substance Abuse: No Compulsions: No  Neurologic: Headache: No Seizure: No Paresthesias: No    Outpatient Encounter Prescriptions as of 08/13/2015  Medication Sig  . ALPRAZolam (XANAX) 1 MG tablet Take 1 tablet (1 mg total) by mouth 2 (two) times daily.  Marland Kitchen buPROPion (WELLBUTRIN XL) 150 MG 24 hr tablet Take 1 tablet (150 mg total) by mouth daily.  . clobetasol (TEMOVATE) 0.05 % external solution Apply 1 application topically 2 (two) times daily.   . cyclobenzaprine (FLEXERIL) 5 MG tablet Take 5 mg by mouth 3 (three) times daily as needed for muscle spasms.  . fluticasone (FLONASE) 50 MCG/ACT nasal spray Place 1 spray into the nose daily.   . hydroxychloroquine (PLAQUENIL) 200 MG tablet Take 200 mg by mouth 2 (two) times daily.   Marland Kitchen omeprazole (PRILOSEC) 20 MG capsule Take 20 mg by mouth 2 (two) times daily before a meal.  . traMADol (ULTRAM) 50 MG tablet take 1 tablet by mouth every 8 hours if needed for pain  . [DISCONTINUED] ALPRAZolam Duanne Moron) 1  MG tablet Take 1 tablet (1 mg total) by mouth 2 (two) times daily.  . [DISCONTINUED] buPROPion (WELLBUTRIN XL) 150 MG 24 hr tablet Take 1 tablet (150 mg total) by mouth daily.  . [DISCONTINUED] RA CALCIUM 600/VITAMIN D-3 600-400 MG-UNIT TABS Take 1 tablet by mouth 2 (two) times daily with a meal.   No  facility-administered encounter medications on file as of 08/13/2015.    No results found for this or any previous visit (from the past 2160 hour(s)).   Physical Exam: Constitutional:  BP 124/76 mmHg  Pulse 60  Ht 5\' 5"  (1.651 m)  Wt 154 lb 3.2 oz (69.945 kg)  BMI 25.66 kg/m2  Musculoskeletal: Strength & Muscle Tone: within normal limits Gait & Station: normal Patient leans: N/A  Mental Status Examination;  Patient is casually dressed and fairly groomed.  She is pleasant and cooperative.  She maintained good eye contact.  Her speech is slow but clear and coherent. Her thought processes logical and goal-directed. She has no flight of ideas or loose association. Her fund of knowledge is adequate. She denies any active or passive suicidal thoughts or homicidal thoughts and she denies any auditory or visual hallucination.  There were no delusions , paranoia or obsession .  She's alert and oriented x3. Her insight judgment and impulse control is okay.  Established Problem, Stable/Improving (1), Review or order clinical lab tests (1), Review of Last Therapy Session (1) and Review of Medication Regimen & Side Effects (2)  Assessment: Axis I: Depressive disorder NOS, mood disorder due to general medical condition  Axis II: Deferred  Axis III:  Past Medical History  Diagnosis Date  . Lupus ([place])   . Myositis   . CRI (chronic renal insufficiency)   . Rosacea   . Headache(784.0)   . HTN (hypertension)    Plan:  Patient is a stable on her current medication.  I review her last blood work.  Her creatinine is 1.4 and her glucoses 65 which was done in October. I will continue Wellbutrin XL 150 mg daily and Xanax 1 mg twice a day.  Patient does not asked for early refills of benzodiazepine.  Discussed medication side effects and benefits.  Recommended to call us back if she has any question or any concern.  Follow-up in 3 months.   ARFEEN,SYED T., MD 08/13/2015

## 2015-10-03 DIAGNOSIS — R911 Solitary pulmonary nodule: Secondary | ICD-10-CM | POA: Diagnosis not present

## 2015-10-03 DIAGNOSIS — M3320 Polymyositis, organ involvement unspecified: Secondary | ICD-10-CM | POA: Diagnosis not present

## 2015-10-03 DIAGNOSIS — E559 Vitamin D deficiency, unspecified: Secondary | ICD-10-CM | POA: Diagnosis not present

## 2015-10-03 DIAGNOSIS — M3219 Other organ or system involvement in systemic lupus erythematosus: Secondary | ICD-10-CM | POA: Diagnosis not present

## 2015-11-11 ENCOUNTER — Ambulatory Visit (HOSPITAL_COMMUNITY): Payer: Self-pay | Admitting: Psychiatry

## 2015-11-13 ENCOUNTER — Telehealth (HOSPITAL_COMMUNITY): Payer: Self-pay

## 2015-11-13 DIAGNOSIS — F329 Major depressive disorder, single episode, unspecified: Secondary | ICD-10-CM

## 2015-11-13 DIAGNOSIS — F32A Depression, unspecified: Secondary | ICD-10-CM

## 2015-11-13 NOTE — Telephone Encounter (Signed)
Patient is calling she needs a refill on her alprazolam. She was last here on 12/13 and has a f/u appointment on 3/28, okay to refill? Please advise, thank you

## 2015-11-14 MED ORDER — ALPRAZOLAM 1 MG PO TABS: 1.0000 mg | ORAL_TABLET | Freq: Two times a day (BID) | ORAL | Status: DC

## 2015-11-14 NOTE — Telephone Encounter (Signed)
This was called into the patients pharmacy per Dr. Adele Schilder for 1 month.

## 2015-11-26 ENCOUNTER — Encounter (HOSPITAL_COMMUNITY): Payer: Self-pay | Admitting: Psychiatry

## 2015-11-26 ENCOUNTER — Ambulatory Visit (INDEPENDENT_AMBULATORY_CARE_PROVIDER_SITE_OTHER): Payer: Medicare Other | Admitting: Psychiatry

## 2015-11-26 VITALS — BP 148/88 | HR 44 | Ht 65.0 in | Wt 162.2 lb

## 2015-11-26 DIAGNOSIS — F329 Major depressive disorder, single episode, unspecified: Secondary | ICD-10-CM

## 2015-11-26 DIAGNOSIS — F32A Depression, unspecified: Secondary | ICD-10-CM

## 2015-11-26 MED ORDER — ALPRAZOLAM 1 MG PO TABS: 1.0000 mg | ORAL_TABLET | Freq: Two times a day (BID) | ORAL | Status: DC

## 2015-11-26 MED ORDER — BUPROPION HCL ER (XL) 150 MG PO TB24: 150.0000 mg | ORAL_TABLET | Freq: Every day | ORAL | Status: DC

## 2015-11-26 NOTE — Progress Notes (Signed)
Jackie Goodman Progress Note  [name] [id number] 59 y.o.  11/26/2015 10:12 AM  Chief Complaint:  Medication management and followup.  History of Present Illness:  Jackie Goodman came for her followup appointment.  She is taking her medication as prescribed.  She is concerned about her 23 year old brother who has mental illness and recently admitted to psychiatric hospital due to worsening of psychosis and dementia.  She is pleased that he is making progress and doing very well.  Overall she described her mood is as stable.  She denies any irritability, anger, mood swing.  She is scheduled to have CT scan chest for mitral valve prolapse .  She recently visited her rheumatologist and there has been no changes in her medication.  She is taking Wellbutrin and Xanax.  She denies any irritability, anger, paranoia or any hallucination.  She denies any crying spells.  Her sleep is good.  She has no tremors, shakes or any EPS.  Her energy level is okay.  Her appetite is okay.  Her vitals are stable.  Patient denies drinking or using any illegal substances.  Her last lab was drawn on February 6 , 2017 which shows WBC: 4.4, hemoglobin 30.7, hematocrit 42.9 and platelet count 260.  Her creatinine is 1.5 stable, AST and ALT is normal.  Suicidal Ideation: No Plan Formed: No Patient has means to carry out plan: No  Homicidal Ideation: No Plan Formed: No Patient has means to carry out plan: No  Medical History; Patient has lupus, chronic renal insufficiency, mitral valve prolapse , myositis, migraine headaches, chronic pain.  Most of her physicians are at [facility] .  Her rheumatologist is Dr. [name], her nephrologist is Dr [name] and her primary care physician is Dr. [name] in [place].    Past Psychiatric History/Hospitalization(s) Patient has depression since 1993 when she was diagnosed with lupus.  She was seeing psychiatrist at [facility] clinic however she was not happy and decided to  change her psychiatrist.  She is seen in this office since April 2009.  In the past she has taken Zoloft.  Patient denies any inpatient psychiatric treatment, paranoia, hallucination or any suicidal thoughts. Anxiety: Yes Bipolar Disorder: No Depression: No Mania: No Psychosis: No Schizophrenia: No Personality Disorder: No Hospitalization for psychiatric illness: No History of Electroconvulsive Shock Therapy: No Prior Suicide Attempts: No   Review of Systems  Cardiovascular: Negative for chest pain and palpitations.  Musculoskeletal: Positive for back pain and joint pain.  Skin: Negative for itching and rash.  Neurological: Negative for dizziness, tremors and headaches.  Psychiatric/Behavioral: Negative for depression, suicidal ideas and substance abuse.    Psychiatric: Agitation: No Hallucination: No Depressed Mood: No Insomnia: No Hypersomnia: No Altered Concentration: No Feels Worthless: No Grandiose Ideas: No Belief In Special Powers: No New/Increased Substance Abuse: No Compulsions: No  Neurologic: Headache: No Seizure: No Paresthesias: No    Outpatient Encounter Prescriptions as of 11/26/2015  Medication Sig  . ALPRAZolam (XANAX) 1 MG tablet Take 1 tablet (1 mg total) by mouth 2 (two) times daily.  Marland Kitchen buPROPion (WELLBUTRIN XL) 150 MG 24 hr tablet Take 1 tablet (150 mg total) by mouth daily.  . clobetasol (TEMOVATE) 0.05 % external solution Apply 1 application topically 2 (two) times daily.   . cyclobenzaprine (FLEXERIL) 5 MG tablet Take 5 mg by mouth 3 (three) times daily as needed for muscle spasms.  . fluticasone (FLONASE) 50 MCG/ACT nasal spray Place 1 spray into the nose daily.   . hydroxychloroquine (PLAQUENIL)  200 MG tablet Take 200 mg by mouth 2 (two) times daily.   Marland Kitchen omeprazole (PRILOSEC) 20 MG capsule Take 20 mg by mouth 2 (two) times daily before a meal.  . traMADol (ULTRAM) 50 MG tablet take 1 tablet by mouth every 8 hours if needed for pain  .  [DISCONTINUED] ALPRAZolam (XANAX) 1 MG tablet Take 1 tablet (1 mg total) by mouth 2 (two) times daily.  . [DISCONTINUED] buPROPion (WELLBUTRIN XL) 150 MG 24 hr tablet Take 1 tablet (150 mg total) by mouth daily.   No facility-administered encounter medications on file as of 11/26/2015.    No results found for this or any previous visit (from the past 2160 hour(s)).   Physical Exam: Constitutional:  BP 148/88 mmHg  Pulse 44  Ht 5\' 5"  (1.651 m)  Wt 162 lb 3.2 oz (73.573 kg)  BMI 26.99 kg/m2  Musculoskeletal: Strength & Muscle Tone: within normal limits Gait & Station: normal Patient leans: N/A  Mental Status Examination;  Patient is casually dressed and groomed.  She is pleasant and cooperative.  She maintained good eye contact.  Her speech is slow but clear and coherent. Her thought processes logical and goal-directed. She has no flight of ideas or loose association. Her fund of knowledge is adequate. She denies any active or passive suicidal thoughts or homicidal thoughts and she denies any auditory or visual hallucination.  There were no delusions , paranoia or obsession .  She's alert and oriented x3. Her insight judgment and impulse control is okay.  Established Problem, Stable/Improving (1), Review or order clinical lab tests (1), Review of Last Therapy Session (1) and Review of Medication Regimen & Side Effects (2)  Assessment: Axis I: Depressive disorder NOS, mood disorder due to general medical condition  Axis II: Deferred  Axis III:  Past Medical History  Diagnosis Date  . Lupus ([place])   . Myositis   . CRI (chronic renal insufficiency)   . Rosacea   . Headache(784.0)   . HTN (hypertension)    Plan:  Patient is a stable on her current medication.  I review her last blood work.  She is taking Wellbutrin XL 150 mg daily and Xanax 1 mg twice a day.  Patient does not asked for early refills of benzodiazepine.  Discussed medication side effects and benefits including  benzodiazepine dependence, tolerance and withdrawals.  Recommended to call us back if she has any question or any concern.  Follow-up in 3 months.   Harika Laidlaw T., MD 11/26/2015

## 2016-01-10 DIAGNOSIS — R918 Other nonspecific abnormal finding of lung field: Secondary | ICD-10-CM | POA: Diagnosis not present

## 2016-01-10 DIAGNOSIS — M329 Systemic lupus erythematosus, unspecified: Secondary | ICD-10-CM | POA: Diagnosis not present

## 2016-01-10 DIAGNOSIS — M3320 Polymyositis, organ involvement unspecified: Secondary | ICD-10-CM | POA: Diagnosis not present

## 2016-01-13 DIAGNOSIS — H0014 Chalazion left upper eyelid: Secondary | ICD-10-CM | POA: Diagnosis not present

## 2016-02-26 ENCOUNTER — Encounter (HOSPITAL_COMMUNITY): Payer: Self-pay | Admitting: Psychiatry

## 2016-02-26 ENCOUNTER — Ambulatory Visit (INDEPENDENT_AMBULATORY_CARE_PROVIDER_SITE_OTHER): Payer: Medicare Other | Admitting: Psychiatry

## 2016-02-26 VITALS — BP 122/78 | HR 52 | Ht 65.0 in | Wt 163.8 lb

## 2016-02-26 DIAGNOSIS — F329 Major depressive disorder, single episode, unspecified: Secondary | ICD-10-CM | POA: Diagnosis not present

## 2016-02-26 DIAGNOSIS — F32A Depression, unspecified: Secondary | ICD-10-CM

## 2016-02-26 MED ORDER — ALPRAZOLAM 1 MG PO TABS: 1.0000 mg | ORAL_TABLET | Freq: Two times a day (BID) | ORAL | Status: DC

## 2016-02-26 MED ORDER — BUPROPION HCL ER (XL) 150 MG PO TB24: 150.0000 mg | ORAL_TABLET | Freq: Every day | ORAL | Status: DC

## 2016-02-26 NOTE — Progress Notes (Signed)
[name] Progress Note  Jackie Goodman [id number] 60 y.o.  02/26/2016 10:41 AM  Chief Complaint:  Medication management and followup.  History of Present Illness:  Jackie Goodman came for her followup appointment.  She is doing well on her current psychiatric medication.  She is taking Xanax and Wellbutrin.  She has no side effects.  Recently she had a stye on her left eye.  She has taken ointment to help her and now it is getting better.  She recently visited her rheumatologist and she had blood work.  On May 12 she has learned work shows creatinine 1.4, calcium 10.5, total protein 7.9.  Her LFTs were normal.  She scheduled to see her nephrologist in few weeks.  She described her depression is under control.  She denies any irritability, anger, mania, psychosis or any hallucination.  She denies any feeling of hopelessness or worthlessness.  She is relieved that her brother is doing well and has dementia and psychosis as family is taking care of him and other brother is giving him medication on time.  Patient denies drinking or using any illegal substances.  She is keeping herself busy by taking care of 61-year-old grandchild.  Patient wants to continue Wellbutrin and Xanax.  She has no side effects.  Her appetite is okay.  Her vital signs are stable.  Suicidal Ideation: No Plan Formed: No Patient has means to carry out plan: No  Homicidal Ideation: No Plan Formed: No Patient has means to carry out plan: No  Medical History; Patient has lupus, chronic renal insufficiency, mitral valve prolapse , myositis, migraine headaches, chronic pain.  Most of her physicians are at [facility] .  Her rheumatologist is Dr. [name], her nephrologist is Dr [name] and her primary care physician is Dr. [name] in [place].    Past Psychiatric History/Hospitalization(s) Patient has depression since 1993 when she was diagnosed with lupus.  She was seeing psychiatrist at [facility] clinic however she was  not happy and decided to change her psychiatrist.  She is seen in this office since April 2009.  In the past she has taken Zoloft.  Patient denies any inpatient psychiatric treatment, paranoia, hallucination or any suicidal thoughts. Anxiety: Yes Bipolar Disorder: No Depression: No Mania: No Psychosis: No Schizophrenia: No Personality Disorder: No Hospitalization for psychiatric illness: No History of Electroconvulsive Shock Therapy: No Prior Suicide Attempts: No   Review of Systems  Cardiovascular: Negative for chest pain and palpitations.  Musculoskeletal: Positive for back pain and joint pain.  Skin: Negative for itching and rash.  Neurological: Negative for dizziness, tremors and headaches.  Psychiatric/Behavioral: Negative for depression, suicidal ideas and substance abuse.    Psychiatric: Agitation: No Hallucination: No Depressed Mood: No Insomnia: No Hypersomnia: No Altered Concentration: No Feels Worthless: No Grandiose Ideas: No Belief In Special Powers: No New/Increased Substance Abuse: No Compulsions: No  Neurologic: Headache: No Seizure: No Paresthesias: No    Outpatient Encounter Prescriptions as of 02/26/2016  Medication Sig  . ALPRAZolam (XANAX) 1 MG tablet Take 1 tablet (1 mg total) by mouth 2 (two) times daily.  Marland Kitchen buPROPion (WELLBUTRIN XL) 150 MG 24 hr tablet Take 1 tablet (150 mg total) by mouth daily.  . clobetasol (TEMOVATE) 0.05 % external solution Apply 1 application topically 2 (two) times daily.   . fluticasone (FLONASE) 50 MCG/ACT nasal spray Place 1 spray into the nose daily.   . hydroxychloroquine (PLAQUENIL) 200 MG tablet Take 200 mg by mouth 2 (two) times daily.   Marland Kitchen  omeprazole (PRILOSEC) 20 MG capsule Take 20 mg by mouth 2 (two) times daily before a meal.  . traMADol (ULTRAM) 50 MG tablet take 1 tablet by mouth every 8 hours if needed for pain  . [DISCONTINUED] ALPRAZolam (XANAX) 1 MG tablet Take 1 tablet (1 mg total) by mouth 2 (two)  times daily.  . [DISCONTINUED] buPROPion (WELLBUTRIN XL) 150 MG 24 hr tablet Take 1 tablet (150 mg total) by mouth daily.  . [DISCONTINUED] cyclobenzaprine (FLEXERIL) 5 MG tablet Take 5 mg by mouth 3 (three) times daily as needed for muscle spasms.   No facility-administered encounter medications on file as of 02/26/2016.    No results found for this or any previous visit (from the past 2160 hour(s)).   Physical Exam: Constitutional:  BP 122/78 mmHg  Pulse 52  Ht 5\' 5"  (1.651 m)  Wt 163 lb 12.8 oz (74.299 kg)  BMI 27.26 kg/m2  Musculoskeletal: Strength & Muscle Tone: within normal limits Gait & Station: normal Patient leans: N/A  Mental Status Examination;  Patient is casually dressed and groomed.  She is pleasant and cooperative.  She maintained good eye contact.  Her speech is slow but clear and coherent. Her thought processes logical and goal-directed. She has no flight of ideas or loose association. Her fund of knowledge is adequate. She denies any active or passive suicidal thoughts or homicidal thoughts and she denies any auditory or visual hallucination.  There were no delusions , paranoia or obsession .  She's alert and oriented x3. Her insight judgment and impulse control is okay.  Established Problem, Stable/Improving (1), Review or order clinical lab tests (1), Review of Last Therapy Session (1) and Review of Medication Regimen & Side Effects (2)  Assessment: Axis I: Depressive disorder NOS, mood disorder due to general medical condition  Axis II: Deferred  Axis III:  Past Medical History  Diagnosis Date  . Lupus ([place])   . Myositis   . CRI (chronic renal insufficiency)   . Rosacea   . Headache(784.0)   . HTN (hypertension)    Plan:  Patient is a stable on her current medication.  I will continue Wellbutrin XL 150 mg daily and Xanax 1 mg twice a day.  Patient does not asked for early refills of benzodiazepine.  Discussed medication side effects and benefits  including benzodiazepine dependence, tolerance and withdrawals.  Recommended to call us back if she has any question or any concern.  Follow-up in 3 months.   Lior Cartelli T., MD 02/26/2016

## 2016-05-28 ENCOUNTER — Ambulatory Visit (INDEPENDENT_AMBULATORY_CARE_PROVIDER_SITE_OTHER): Payer: Medicare Other | Admitting: Psychiatry

## 2016-05-28 ENCOUNTER — Encounter (HOSPITAL_COMMUNITY): Payer: Self-pay | Admitting: Psychiatry

## 2016-05-28 DIAGNOSIS — F39 Unspecified mood [affective] disorder: Secondary | ICD-10-CM

## 2016-05-28 DIAGNOSIS — F329 Major depressive disorder, single episode, unspecified: Secondary | ICD-10-CM

## 2016-05-28 DIAGNOSIS — F32A Depression, unspecified: Secondary | ICD-10-CM

## 2016-05-28 MED ORDER — ALPRAZOLAM 1 MG PO TABS: 1.0000 mg | ORAL_TABLET | Freq: Two times a day (BID) | ORAL | 2 refills | Status: DC

## 2016-05-28 MED ORDER — BUPROPION HCL ER (XL) 150 MG PO TB24: 150.0000 mg | ORAL_TABLET | Freq: Every day | ORAL | 2 refills | Status: DC

## 2016-05-28 NOTE — Progress Notes (Signed)
[place] Progress Note  Jackie Goodman [id number] 59 y.o.  05/28/2016 10:53 AM  Chief Complaint:  Medication management and followup.  History of Present Illness:  Jackie Goodman came for her followup appointment.  She is a stable on her current psychiatric medication.  She reported no side effects.  She denies any major panic attack or nervousness.  Her brother is doing very well.  She scheduled to see her rheumatologist and nephrologist next week.  She will have blood work.  Her depression is well controlled.  She like Wellbutrin and Xanax.  She denies any mania, hallucination, depression, suicidal thoughts or homicidal thoughts.  Her sleep is good.  Her appetite is okay.  She is keeping herself busy by taking care of Jackie Goodman.  Today her Goodman came with her.  Patient has no tremors, shakes or any side effects.  Patient denies drinking or using any illegal substances.  Her vital signs are okay.  Suicidal Ideation: No Plan Formed: No Patient has means to carry out plan: No  Homicidal Ideation: No Plan Formed: No Patient has means to carry out plan: No  Medical History; Patient has lupus, chronic renal insufficiency, mitral valve prolapse , myositis, migraine headaches, chronic pain.  Most of her physicians are at [facility] .  Her rheumatologist is Dr. [name], her nephrologist is Dr [name] and her primary care physician is Dr. [name] in [place].    Past Psychiatric History/Hospitalization(s) Patient has depression since 1993 when she was diagnosed with lupus.  She was seeing psychiatrist at [facility] clinic however she was not happy and decided to change her psychiatrist.  She is seen in this office since April 2009.  In the past she has taken Zoloft.  Patient denies any inpatient psychiatric treatment, paranoia, hallucination or any suicidal thoughts. Anxiety: Yes Bipolar Disorder: No Depression: No Mania: No Psychosis: No Schizophrenia:  No Personality Disorder: No Hospitalization for psychiatric illness: No History of Electroconvulsive Shock Therapy: No Prior Suicide Attempts: No   Review of Systems  Cardiovascular: Negative for chest pain and palpitations.  Musculoskeletal: Positive for back pain and joint pain.  Skin: Negative for itching and rash.  Neurological: Negative for dizziness, tremors and headaches.  Psychiatric/Behavioral: Negative for depression, substance abuse and suicidal ideas.    Psychiatric: Agitation: No Hallucination: No Depressed Mood: No Insomnia: No Hypersomnia: No Altered Concentration: No Feels Worthless: No Grandiose Ideas: No Belief In Special Powers: No New/Increased Substance Abuse: No Compulsions: No  Neurologic: Headache: No Seizure: No Paresthesias: No    Outpatient Encounter Prescriptions as of 05/28/2016  Medication Sig  . ALPRAZolam (XANAX) 1 MG tablet Take 1 tablet (1 mg total) by mouth 2 (two) times daily.  Marland Kitchen buPROPion (WELLBUTRIN XL) 150 MG 24 hr tablet Take 1 tablet (150 mg total) by mouth daily.  . clobetasol (TEMOVATE) 0.05 % external solution Apply 1 application topically 2 (two) times daily.   . fluticasone (FLONASE) 50 MCG/ACT nasal spray Place 1 spray into the nose daily.   . hydroxychloroquine (PLAQUENIL) 200 MG tablet Take 200 mg by mouth 2 (two) times daily.   Marland Kitchen omeprazole (PRILOSEC) 20 MG capsule Take 20 mg by mouth 2 (two) times daily before a meal.  . traMADol (ULTRAM) 50 MG tablet take 1 tablet by mouth every 8 hours if needed for pain  . [DISCONTINUED] ALPRAZolam (XANAX) 1 MG tablet Take 1 tablet (1 mg total) by mouth 2 (two) times daily.  . [DISCONTINUED] buPROPion (WELLBUTRIN XL) 150 MG 24 hr tablet  Take 1 tablet (150 mg total) by mouth daily.   No facility-administered encounter medications on file as of 05/28/2016.     No results found for this or any previous visit (from the past 2160 hour(s)).   Physical Exam: Constitutional:  BP 108/68  (BP Location: Left Arm, Patient Position: Sitting, Cuff Size: Normal)   Pulse 60   Ht _0  (1.651 m)   Wt 162 lb (73.5 kg)   BMI 26.96 kg/m   Musculoskeletal: Strength & Muscle Tone: within normal limits Gait & Station: normal Patient leans: N/A  Mental Status Examination;  Patient is casually dressed and groomed.  She is pleasant and cooperative.  She maintained good eye contact.  Her speech is slow but clear and coherent. Her thought processes logical and goal-directed. She has no flight of ideas or loose association. Her fund of knowledge is adequate. She denies any active or passive suicidal thoughts or homicidal thoughts and she denies any auditory or visual hallucination.  There were no delusions , paranoia or obsession .  She's alert and oriented x3. Her insight judgment and impulse control is okay.  Established Problem, Stable/Improving (1), Review of Last Therapy Session (1) and Review of Medication Regimen & Side Effects (2)  Assessment: Axis I: Depressive disorder NOS, mood disorder due to general medical condition  Axis II: Deferred  Axis III:  Past Medical History:  Diagnosis Date  . CRI (chronic renal insufficiency)   . Headache(784.0)   . HTN (hypertension)   . Lupus ([place])   . Myositis   . Rosacea    Plan:  Patient is a stable on her current medication.  She has no side effects.  I will continue Wellbutrin XL 150 mg daily and Xanax 1 mg twice a day.  Patient does not asked for early refills of benzodiazepine.  Discussed medication side effects and benefits including benzodiazepine dependence, tolerance and withdrawals.  Recommended to call us back if she has any question or any concern.  Follow-up in 3 months.   Jackie Cangemi T., MD 05/28/2016         Patient ID: Jackie Goodman, female   DOB: 11/02/1955, 60 y.o.   MRN: [id number]

## 2016-07-11 DIAGNOSIS — Z23 Encounter for immunization: Secondary | ICD-10-CM | POA: Diagnosis not present

## 2016-09-08 ENCOUNTER — Ambulatory Visit (INDEPENDENT_AMBULATORY_CARE_PROVIDER_SITE_OTHER): Payer: Medicare Other | Admitting: Psychiatry

## 2016-09-08 ENCOUNTER — Encounter (HOSPITAL_COMMUNITY): Payer: Self-pay | Admitting: Psychiatry

## 2016-09-08 VITALS — BP 126/74 | HR 49 | Ht 65.0 in | Wt 167.0 lb

## 2016-09-08 DIAGNOSIS — Z9889 Other specified postprocedural states: Secondary | ICD-10-CM

## 2016-09-08 DIAGNOSIS — F17210 Nicotine dependence, cigarettes, uncomplicated: Secondary | ICD-10-CM | POA: Diagnosis not present

## 2016-09-08 DIAGNOSIS — F330 Major depressive disorder, recurrent, mild: Secondary | ICD-10-CM | POA: Diagnosis not present

## 2016-09-08 DIAGNOSIS — Z818 Family history of other mental and behavioral disorders: Secondary | ICD-10-CM

## 2016-09-08 DIAGNOSIS — Z880 Allergy status to penicillin: Secondary | ICD-10-CM

## 2016-09-08 DIAGNOSIS — Z79899 Other long term (current) drug therapy: Secondary | ICD-10-CM

## 2016-09-08 DIAGNOSIS — Z888 Allergy status to other drugs, medicaments and biological substances status: Secondary | ICD-10-CM

## 2016-09-08 MED ORDER — BUPROPION HCL ER (XL) 150 MG PO TB24: 150.0000 mg | ORAL_TABLET | Freq: Every day | ORAL | 2 refills | Status: DC

## 2016-09-08 MED ORDER — ALPRAZOLAM 1 MG PO TABS: 1.0000 mg | ORAL_TABLET | Freq: Two times a day (BID) | ORAL | 2 refills | Status: DC

## 2016-09-08 NOTE — Progress Notes (Signed)
[facility] MD/PA/NP OP Progress Note  09/08/2016 11:04 AM Jackie Goodman  MRN:  [id number]  Chief Complaint:  Chief Complaint    Follow-up     Subjective:  I'm doing fine.  HPI: Jackie Goodman came for her follow-up appointment.  She is taking Xanax and Wellbutrin.  She reported no side effects.  Her depression is a stable.  She had a good Christmas.  She sleeping good.  She denies any irritability, mania, hallucination or any crying spells.  She has no tremors or shakes.  She continued to enjoy HER-25-year-old granddaughter babysitting.  Patient denies drinking alcohol or using any illegal substances.  Her energy level is good.  Her vital signs are stable.  Patient have appointments to see her rheumatologist at [facility] this month  Visit Diagnosis:    ICD-9-CM ICD-10-CM   1. Mild episode of recurrent major depressive disorder (HCC) 296.31 F33.0 buPROPion (WELLBUTRIN XL) 150 MG 24 hr tablet     ALPRAZolam (XANAX) 1 MG tablet    Past Psychiatric History: Reviewed.  Past Medical History:  Past Medical History:  Diagnosis Date  . CRI (chronic renal insufficiency)   . Headache(784.0)   . HTN (hypertension)   . Lupus   . Myositis   . Rosacea     Past Surgical History:  Procedure Laterality Date  . ABDOMINAL HYSTERECTOMY    . TONSILLECTOMY      Family Psychiatric History: Reviewed.  Family History:  Family History  Problem Relation Age of Onset  . Depression Brother     Social History:  Social History   Social History  . Marital status: Married    Spouse name: N/A  . Number of children: N/A  . Years of education: N/A   Social History Main Topics  . Smoking status: Current Every Day Smoker    Packs/day: 0.25    Years: 10.00  . Smokeless tobacco: Never Used  . Alcohol use No  . Drug use: No  . Sexual activity: Yes    Partners: Male    Birth control/ protection: Condom   Other Topics Concern  . Not on file   Social History Narrative  . No narrative on file    Allergies:   Allergies  Allergen Reactions  . Amoxicillin Anaphylaxis  . Sulfa Antibiotics Anaphylaxis  . Tetracyclines & Related Swelling  . Vicodin [Hydrocodone-Acetaminophen] Itching    Metabolic Disorder Labs: No results found for: HGBA1C, MPG No results found for: PROLACTIN No results found for: CHOL, TRIG, HDL, CHOLHDL, VLDL, LDLCALC   Current Medications: Current Outpatient Prescriptions  Medication Sig Dispense Refill  . ALPRAZolam (XANAX) 1 MG tablet Take 1 tablet (1 mg total) by mouth 2 (two) times daily. 60 tablet 2  . buPROPion (WELLBUTRIN XL) 150 MG 24 hr tablet Take 1 tablet (150 mg total) by mouth daily. 30 tablet 2  . clobetasol (TEMOVATE) 0.05 % external solution Apply 1 application topically 2 (two) times daily.     . fluticasone (FLONASE) 50 MCG/ACT nasal spray Place 1 spray into the nose daily.     . hydroxychloroquine (PLAQUENIL) 200 MG tablet Take 200 mg by mouth 2 (two) times daily.     Marland Kitchen omeprazole (PRILOSEC) 20 MG capsule Take 20 mg by mouth 2 (two) times daily before a meal.    . traMADol (ULTRAM) 50 MG tablet take 1 tablet by mouth every 8 hours if needed for pain  0   No current facility-administered medications for this visit.     Neurologic: Headache: No  Seizure: No Paresthesias: No  Musculoskeletal: Strength & Muscle Tone: within normal limits Gait & Station: normal Patient leans: N/A  Psychiatric Specialty Exam: ROS  Blood pressure 126/74, pulse (!) 49, height _0  (1.651 m), weight 167 lb (75.8 kg).Body mass index is 27.79 kg/m.  General Appearance: Casual  Eye Contact:  Good  Speech:  Clear and Coherent  Volume:  Normal  Mood:  Euthymic  Affect:  Appropriate  Thought Process:  Goal Directed  Orientation:  Full (Time, Place, and Person)  Thought Content: WDL and Logical   Suicidal Thoughts:  No  Homicidal Thoughts:  No  Memory:  Immediate;   Good Recent;   Good Remote;   Good  Judgement:  Good  Insight:  Good  Psychomotor Activity:   Normal  Concentration:  Concentration: Good and Attention Span: Good  Recall:  Good  Fund of Knowledge: Good  Language: Good  Akathisia:  No  Handed:  Right  AIMS (if indicated):  None reported   Assets:  Communication Skills Desire for Improvement Housing Physical Health Resilience Social Support  ADL's:  Intact  Cognition: WNL  Sleep:  Good     Assessment: Jackie Goodman is 61 year old African-American female came for her follow-up appointment.  She has depression and anxiety.  Plan: Patient is a stable on her current psychiatric medication.  I will continue Wellbutrin XL 150 mg daily and Xanax 1 mg twice a day.  Patient does not ask for early refills of benzodiazepine.  His case medication side effects and benefits.  Recommended to call us back if she has any question, concern if she feels worsening of the symptom.  Follow-up in 3 months.  Ernie Kasler T., MD 09/08/2016, 11:04 AM

## 2016-11-06 DIAGNOSIS — M3219 Other organ or system involvement in systemic lupus erythematosus: Secondary | ICD-10-CM | POA: Diagnosis not present

## 2016-11-06 DIAGNOSIS — M791 Myalgia: Secondary | ICD-10-CM | POA: Diagnosis not present

## 2016-11-06 DIAGNOSIS — Z79899 Other long term (current) drug therapy: Secondary | ICD-10-CM | POA: Diagnosis not present

## 2016-12-09 ENCOUNTER — Encounter (HOSPITAL_COMMUNITY): Payer: Self-pay | Admitting: Psychiatry

## 2016-12-09 ENCOUNTER — Ambulatory Visit (INDEPENDENT_AMBULATORY_CARE_PROVIDER_SITE_OTHER): Payer: Medicare Other | Admitting: Psychiatry

## 2016-12-09 DIAGNOSIS — F17210 Nicotine dependence, cigarettes, uncomplicated: Secondary | ICD-10-CM

## 2016-12-09 DIAGNOSIS — Z79899 Other long term (current) drug therapy: Secondary | ICD-10-CM | POA: Diagnosis not present

## 2016-12-09 DIAGNOSIS — F330 Major depressive disorder, recurrent, mild: Secondary | ICD-10-CM

## 2016-12-09 DIAGNOSIS — Z818 Family history of other mental and behavioral disorders: Secondary | ICD-10-CM

## 2016-12-09 MED ORDER — BUPROPION HCL ER (XL) 150 MG PO TB24: 150.0000 mg | ORAL_TABLET | Freq: Every day | ORAL | 2 refills | Status: DC

## 2016-12-09 MED ORDER — ALPRAZOLAM 1 MG PO TABS: 1.0000 mg | ORAL_TABLET | Freq: Two times a day (BID) | ORAL | 2 refills | Status: DC

## 2016-12-09 NOTE — Progress Notes (Signed)
[facility] MD/PA/NP OP Progress Note  12/09/2016 10:39 AM Jackie Goodman  MRN:  [id number]  Chief Complaint:  Chief Complaint    Follow-up     Subjective:  My aunt died 4 weeks ago.  I'm doing fine.  HPI: Jackie Goodman came for her follow-up appointment.  She endorse her onto was sick died 4 weeks ago.  She is grief about the loss but she is doing fine.  She is taking her medication as prescribed.  Recently she moved with her sister month ago.  She used to live with her in the past and she has no issues.  She denies any irritability, anger, mania, psychosis.  She continues to enjoy her great-grandson son babysitting.  She is taking Xanax and Wellbutrin.  She does not ask for early refills.  She recently saw her rheumatologist due to flareup on her lupus.  She had blood work on 11/06/2016 .  Her creatinine was 1.4, AST 17 and ALT 12.  She is now taking prednisone.  She sleeping good.  She denies side effects including any tremors shakes or any EPS.  Her appetite is okay.  Her vital signs are stable.  Her energy level is good.  Visit Diagnosis:    ICD-9-CM ICD-10-CM   1. Mild episode of recurrent major depressive disorder (HCC) 296.31 F33.0 buPROPion (WELLBUTRIN XL) 150 MG 24 hr tablet     ALPRAZolam (XANAX) 1 MG tablet    Past Psychiatric History: Reviewed. Patient has depression since 1993 when she was diagnosed with lupus.  She was seeing psychiatrist at [facility] clinic however she was not happy and decided to change her psychiatrist.  She is seen in this office since April 2009.  In the past she has taken Zoloft.  Patient denies any inpatient psychiatric treatment, paranoia, hallucination or any suicidal thoughts.  Past Medical History:  Past Medical History:  Diagnosis Date  . CRI (chronic renal insufficiency)   . Headache(784.0)   . HTN (hypertension)   . Lupus   . Myositis   . Rosacea     Past Surgical History:  Procedure Laterality Date  . ABDOMINAL HYSTERECTOMY    . TONSILLECTOMY      Family  Psychiatric History: Reviewed.  Family History:  Family History  Problem Relation Age of Onset  . Depression Brother     Social History:  Social History   Social History  . Marital status: Married    Spouse name: N/A  . Number of children: N/A  . Years of education: N/A   Social History Main Topics  . Smoking status: Current Every Day Smoker    Packs/day: 0.25    Years: 10.00  . Smokeless tobacco: Never Used  . Alcohol use No  . Drug use: No  . Sexual activity: Yes    Partners: Male    Birth control/ protection: Condom   Other Topics Concern  . None   Social History Narrative  . None    Allergies:  Allergies  Allergen Reactions  . Amoxicillin Anaphylaxis  . Sulfa Antibiotics Anaphylaxis  . Tetracyclines & Related Swelling  . Vicodin [Hydrocodone-Acetaminophen] Itching    Metabolic Disorder Labs: No results found for: HGBA1C, MPG No results found for: PROLACTIN No results found for: CHOL, TRIG, HDL, CHOLHDL, VLDL, LDLCALC   Current Medications: Current Outpatient Prescriptions  Medication Sig Dispense Refill  . ALPRAZolam (XANAX) 1 MG tablet Take 1 tablet (1 mg total) by mouth 2 (two) times daily. 60 tablet 2  . buPROPion (WELLBUTRIN XL) 150  MG 24 hr tablet Take 1 tablet (150 mg total) by mouth daily. 30 tablet 2  . clobetasol (TEMOVATE) 0.05 % external solution Apply 1 application topically 2 (two) times daily.     . fluticasone (FLONASE) 50 MCG/ACT nasal spray Place 1 spray into the nose daily.     . hydroxychloroquine (PLAQUENIL) 200 MG tablet Take 200 mg by mouth 2 (two) times daily.     Marland Kitchen omeprazole (PRILOSEC) 20 MG capsule Take 20 mg by mouth 2 (two) times daily before a meal.    . predniSONE (DELTASONE) 5 MG tablet Take 5 mg by mouth 2 (two) times daily with a meal.    . traMADol (ULTRAM) 50 MG tablet take 1 tablet by mouth every 8 hours if needed for pain  0   No current facility-administered medications for this visit.     Neurologic: Headache:  No Seizure: No Paresthesias: No  Musculoskeletal: Strength & Muscle Tone: within normal limits Gait & Station: normal Patient leans: N/A  Psychiatric Specialty Exam: ROS  Blood pressure 126/78, pulse 69, height 5\' 5"  (1.651 m), weight 167 lb 12.8 oz (76.1 kg), SpO2 99 %.Body mass index is 27.92 kg/m.  General Appearance: Casual  Eye Contact:  Good  Speech:  Clear and Coherent  Volume:  Normal  Mood:  Euthymic  Affect:  Congruent  Thought Process:  Goal Directed  Orientation:  Full (Time, Place, and Person)  Thought Content: Logical   Suicidal Thoughts:  No  Homicidal Thoughts:  No  Memory:  Immediate;   Good Recent;   Good Remote;   Good  Judgement:  Good  Insight:  Good  Psychomotor Activity:  Normal  Concentration:  Concentration: Good and Attention Span: Good  Recall:  Good  Fund of Knowledge: Good  Language: Good  Akathisia:  No  Handed:  Right  AIMS (if indicated):  0  Assets:  Communication Skills Desire for Improvement Resilience Social Support  ADL's:  Intact  Cognition: WNL  Sleep:  ok   Assessment: Major depressive disorder, recurrent.  Plan: Patient is a stable on Wellbutrin and Xanax.  She has no tremors shakes or any EPS.  I will continue Wellbutrin XL 150 mg daily and Xanax 1 mg twice a day.  Discuss medication side effects and benefits.  Recommended to call us back if she has any question, concern if she feels worsening of the symptom.  Follow-up in 3 months  Jackie Goodman T., MD 12/09/2016, 10:39 AM

## 2017-03-10 ENCOUNTER — Encounter (HOSPITAL_COMMUNITY): Payer: Self-pay | Admitting: Psychiatry

## 2017-03-10 ENCOUNTER — Ambulatory Visit (INDEPENDENT_AMBULATORY_CARE_PROVIDER_SITE_OTHER): Payer: Medicare Other | Admitting: Psychiatry

## 2017-03-10 DIAGNOSIS — F330 Major depressive disorder, recurrent, mild: Secondary | ICD-10-CM

## 2017-03-10 DIAGNOSIS — Z818 Family history of other mental and behavioral disorders: Secondary | ICD-10-CM

## 2017-03-10 DIAGNOSIS — F17210 Nicotine dependence, cigarettes, uncomplicated: Secondary | ICD-10-CM

## 2017-03-10 MED ORDER — BUPROPION HCL ER (XL) 150 MG PO TB24: 150.0000 mg | ORAL_TABLET | Freq: Every day | ORAL | 2 refills | Status: DC

## 2017-03-10 MED ORDER — ALPRAZOLAM 1 MG PO TABS: 1.0000 mg | ORAL_TABLET | Freq: Two times a day (BID) | ORAL | 2 refills | Status: DC

## 2017-03-10 NOTE — Progress Notes (Signed)
BH MD/PA/NP OP Progress Note  03/10/2017 10:08 AM Jackie Goodman  MRN:  [id number]  Chief Complaint:  Subjective:  I am doing good.  I had a vacation at [place] with my family and I had a good time.  HPI: Patient came for her follow-up appointment.  She is taking her medication as prescribed and reported no side effects.  Last week she had a vacation with the family at [place] and she had a good time.  She denies any irritability, anger, mania, psychosis or any hallucination.  Her depression and anxiety is controlled with the medication.  She has no tremors or shakes.  Her energy level is good. She is scheduled to see her rheumatologist and dermatologist.  She believe she may have sunburn from last week.  She will also get routine blood work.  Patient denies drinking alcohol or using any illegal substances.  She continues to enjoy her great grandson babysitting.  Her vitals are stable.  Visit Diagnosis:    ICD-10-CM   1. Mild episode of recurrent major depressive disorder (HCC) F33.0 buPROPion (WELLBUTRIN XL) 150 MG 24 hr tablet    ALPRAZolam (XANAX) 1 MG tablet    Past Psychiatric History: Reviewed. Patient has depression since 1993 when she was diagnosed with lupus. She was seeing psychiatrist at [facility] clinic however she was not happy and decided to change her psychiatrist. She is seen in this office since April 2009. In the past she has taken Zoloft. Patient denies any inpatient psychiatric treatment, paranoia, hallucination or any suicidal thoughts.  Past Medical History:  Past Medical History:  Diagnosis Date  . CRI (chronic renal insufficiency)   . Headache(784.0)   . HTN (hypertension)   . Lupus   . Myositis   . Rosacea     Past Surgical History:  Procedure Laterality Date  . ABDOMINAL HYSTERECTOMY    . TONSILLECTOMY      Family Psychiatric History: Reviewed.  Family History:  Family History  Problem Relation Age of Onset  . Depression Brother     Social History:   Social History   Social History  . Marital status: Married    Spouse name: N/A  . Number of children: N/A  . Years of education: N/A   Social History Main Topics  . Smoking status: Current Every Day Smoker    Packs/day: 0.25    Years: 10.00  . Smokeless tobacco: Never Used  . Alcohol use No  . Drug use: No  . Sexual activity: Yes    Partners: Male    Birth control/ protection: Condom   Other Topics Concern  . None   Social History Narrative  . None    Allergies:  Allergies  Allergen Reactions  . Amoxicillin Anaphylaxis  . Sulfa Antibiotics Anaphylaxis  . Tetracyclines & Related Swelling  . Vicodin [Hydrocodone-Acetaminophen] Itching    Metabolic Disorder Labs: No results found for: HGBA1C, MPG No results found for: PROLACTIN No results found for: CHOL, TRIG, HDL, CHOLHDL, VLDL, LDLCALC   Current Medications: Current Outpatient Prescriptions  Medication Sig Dispense Refill  . ALPRAZolam (XANAX) 1 MG tablet Take 1 tablet (1 mg total) by mouth 2 (two) times daily. 60 tablet 2  . buPROPion (WELLBUTRIN XL) 150 MG 24 hr tablet Take 1 tablet (150 mg total) by mouth daily. 30 tablet 2  . clobetasol (TEMOVATE) 0.05 % external solution Apply 1 application topically 2 (two) times daily.     . fluticasone (FLONASE) 50 MCG/ACT nasal spray Place 1 spray into  the nose daily.     . hydroxychloroquine (PLAQUENIL) 200 MG tablet Take 200 mg by mouth 2 (two) times daily.     Marland Kitchen omeprazole (PRILOSEC) 20 MG capsule Take 20 mg by mouth 2 (two) times daily before a meal.    . traMADol (ULTRAM) 50 MG tablet take 1 tablet by mouth every 8 hours if needed for pain  0   No current facility-administered medications for this visit.     Neurologic: Headache: No Seizure: No Paresthesias: No  Musculoskeletal: Strength & Muscle Tone: within normal limits Gait & Station: normal Patient leans: N/A  Psychiatric Specialty Exam: ROS  Blood pressure 122/70, pulse (!) 57, height 5\' 5"   (1.651 m), weight 162 lb 3.2 oz (73.6 kg).Body mass index is 26.99 kg/m.  General Appearance: Casual  Eye Contact:  Good  Speech:  Clear and Coherent  Volume:  Normal  Mood:  Euthymic  Affect:  Congruent  Thought Process:  Goal Directed  Orientation:  Full (Time, Place, and Person)  Thought Content: Logical   Suicidal Thoughts:  No  Homicidal Thoughts:  No  Memory:  Immediate;   Good Recent;   Good Remote;   Good  Judgement:  Good  Insight:  Good  Psychomotor Activity:  Normal  Concentration:  Concentration: Good and Attention Span: Good  Recall:  Good  Fund of Knowledge: Good  Language: Good  Akathisia:  No  Handed:  Right  AIMS (if indicated):  0  Assets:  Communication Skills Desire for Improvement Housing Resilience Social Support  ADL's:  Intact  Cognition: WNL  Sleep:  ok    Assessment: Major depressive disorder, recurrent.  Plan: Patient is a stable on her current psychiatric medication.  I will continue Wellbutrin XL 150 mg daily and Xanax 1 mg twice a day.  Discussed benzodiazepine dependence tolerance and withdrawal.  Recommended to call us back if she has any question, concern or if she feels worsening of the symptom.  Follow-up in 3 months.  Yasin Ducat T., MD 03/10/2017, 10:08 AM

## 2017-04-09 DIAGNOSIS — M329 Systemic lupus erythematosus, unspecified: Secondary | ICD-10-CM | POA: Diagnosis not present

## 2017-04-09 DIAGNOSIS — M3320 Polymyositis, organ involvement unspecified: Secondary | ICD-10-CM | POA: Diagnosis not present

## 2017-04-09 DIAGNOSIS — R300 Dysuria: Secondary | ICD-10-CM | POA: Diagnosis not present

## 2017-06-10 ENCOUNTER — Encounter (HOSPITAL_COMMUNITY): Payer: Self-pay | Admitting: Psychiatry

## 2017-06-10 ENCOUNTER — Ambulatory Visit (INDEPENDENT_AMBULATORY_CARE_PROVIDER_SITE_OTHER): Payer: Medicare Other | Admitting: Psychiatry

## 2017-06-10 DIAGNOSIS — F330 Major depressive disorder, recurrent, mild: Secondary | ICD-10-CM

## 2017-06-10 DIAGNOSIS — Z818 Family history of other mental and behavioral disorders: Secondary | ICD-10-CM

## 2017-06-10 DIAGNOSIS — F17210 Nicotine dependence, cigarettes, uncomplicated: Secondary | ICD-10-CM | POA: Diagnosis not present

## 2017-06-10 MED ORDER — BUPROPION HCL ER (XL) 150 MG PO TB24: 150.0000 mg | ORAL_TABLET | Freq: Every day | ORAL | 2 refills | Status: DC

## 2017-06-10 MED ORDER — ALPRAZOLAM 1 MG PO TABS: 1.0000 mg | ORAL_TABLET | Freq: Two times a day (BID) | ORAL | 2 refills | Status: DC

## 2017-06-10 NOTE — Progress Notes (Signed)
[facility] MD/PA/NP OP Progress Note  06/10/2017 10:27 AM Jackie Goodman  MRN:  [id number]  Chief Complaint:  I'm doing good.  I'm taking medication.  HPI: Jackie Goodman came for her follow-up appointment.  She is compliant with her medication and denies any side effects.  She went to [place] with her son and had a good time.  She denies any irritability, anger, mania or any psychosis.  She sleeping good.  She denies any paranoia or any hallucination.  In August she visit her physician for routine health needs and she had a blood work.  Her creatinine is 1.3 which is stable.  Her energy level is good.  She denies drinking alcohol or using any illegal substances.  She continues to enjoy her great-grandson babysitting.  She denies any feeling of hopelessness or worthlessness.  Her appetite is okay.  Energy level is good.  Her vital signs are stable.  Visit Diagnosis:    ICD-10-CM   1. Mild episode of recurrent major depressive disorder (HCC) F33.0 buPROPion (WELLBUTRIN XL) 150 MG 24 hr tablet    ALPRAZolam (XANAX) 1 MG tablet    Past Psychiatric History: Reviewed. Patient has depression since 1993 when she was diagnosed with lupus. She was seeing psychiatrist at [facility] clinic however she was not happy and decided to change her psychiatrist. She is seen in this office since April 2009. In the past she has taken Zoloft. Patient denies any inpatient psychiatric treatment, paranoia, hallucination or any suicidal thoughts.  Past Medical History:  Past Medical History:  Diagnosis Date  . CRI (chronic renal insufficiency)   . Headache(784.0)   . HTN (hypertension)   . Lupus   . Myositis   . Rosacea     Past Surgical History:  Procedure Laterality Date  . ABDOMINAL HYSTERECTOMY    . TONSILLECTOMY      Family Psychiatric History: Reviewed.  Family History:  Family History  Problem Relation Age of Onset  . Depression Brother     Social History:  Social History   Social History  . Marital status:  Married    Spouse name: N/A  . Number of children: N/A  . Years of education: N/A   Social History Main Topics  . Smoking status: Current Every Day Smoker    Packs/day: 0.25    Years: 10.00  . Smokeless tobacco: Never Used  . Alcohol use No  . Drug use: No  . Sexual activity: Yes    Partners: Male    Birth control/ protection: Condom   Other Topics Concern  . None   Social History Narrative  . None    Allergies:  Allergies  Allergen Reactions  . Amoxicillin Anaphylaxis  . Sulfa Antibiotics Anaphylaxis  . Tetracyclines & Related Swelling  . Vicodin [Hydrocodone-Acetaminophen] Itching    Metabolic Disorder Labs: No results found for: HGBA1C, MPG No results found for: PROLACTIN No results found for: CHOL, TRIG, HDL, CHOLHDL, VLDL, LDLCALC No results found for: TSH  Therapeutic Level Labs: No results found for: LITHIUM No results found for: VALPROATE No components found for:  CBMZ  Current Medications: Current Outpatient Prescriptions  Medication Sig Dispense Refill  . ALPRAZolam (XANAX) 1 MG tablet Take 1 tablet (1 mg total) by mouth 2 (two) times daily. 60 tablet 2  . buPROPion (WELLBUTRIN XL) 150 MG 24 hr tablet Take 1 tablet (150 mg total) by mouth daily. 30 tablet 2  . clobetasol (TEMOVATE) 0.05 % external solution Apply 1 application topically 2 (two) times daily.     Marland Kitchen  fluticasone (FLONASE) 50 MCG/ACT nasal spray Place 1 spray into the nose daily.     . hydroxychloroquine (PLAQUENIL) 200 MG tablet Take 200 mg by mouth 2 (two) times daily.     Marland Kitchen omeprazole (PRILOSEC) 20 MG capsule Take 20 mg by mouth 2 (two) times daily before a meal.    . traMADol (ULTRAM) 50 MG tablet take 1 tablet by mouth every 8 hours if needed for pain  0   No current facility-administered medications for this visit.      Musculoskeletal: Strength & Muscle Tone: within normal limits Gait & Station: normal Patient leans: N/A  Psychiatric Specialty Exam: ROS  Blood pressure  116/78, pulse 65, height 5\' 5"  (1.651 m), weight 159 lb 9.6 oz (72.4 kg).Body mass index is 26.56 kg/m.  General Appearance: Casual  Eye Contact:  Good  Speech:  Clear and Coherent  Volume:  Normal  Mood:  Euthymic  Affect:  Appropriate  Thought Process:  Goal Directed  Orientation:  Full (Time, Place, and Person)  Thought Content: Logical   Suicidal Thoughts:  No  Homicidal Thoughts:  No  Memory:  Immediate;   Good Recent;   Good Remote;   Good  Judgement:  Good  Insight:  Good  Psychomotor Activity:  Normal  Concentration:  Concentration: Good and Attention Span: Good  Recall:  Good  Fund of Knowledge: Good  Language: Good  Akathisia:  No  Handed:  Right  AIMS (if indicated): not done  Assets:  Communication Skills Desire for Improvement Housing Social Support  ADL's:  Intact  Cognition: WNL  Sleep:  Good   Screenings:   Assessment and Plan: Major depressive disorder, recurrent.  Patient doing better on her current medication.  She has no side effects.  I will continue Wellbutrin XL 150 mg daily and Xanax 1 mg twice a day.  Discuss benzodiazepine dependence tolerance and withdrawal.  Recommended to call us back if she has any question, concern or if she feels worsening of the symptom.  Follow-up in 3 months.  Duong Haydel T., MD 06/10/2017, 10:27 AM

## 2017-09-08 ENCOUNTER — Other Ambulatory Visit (HOSPITAL_COMMUNITY): Payer: Self-pay

## 2017-09-08 DIAGNOSIS — F330 Major depressive disorder, recurrent, mild: Secondary | ICD-10-CM

## 2017-09-08 MED ORDER — ALPRAZOLAM 1 MG PO TABS: 1.0000 mg | ORAL_TABLET | Freq: Two times a day (BID) | ORAL | 0 refills | Status: DC

## 2017-09-13 ENCOUNTER — Ambulatory Visit (INDEPENDENT_AMBULATORY_CARE_PROVIDER_SITE_OTHER): Payer: Medicare Other | Admitting: Psychiatry

## 2017-09-13 ENCOUNTER — Encounter (HOSPITAL_COMMUNITY): Payer: Self-pay | Admitting: Psychiatry

## 2017-09-13 DIAGNOSIS — M2550 Pain in unspecified joint: Secondary | ICD-10-CM | POA: Diagnosis not present

## 2017-09-13 DIAGNOSIS — M549 Dorsalgia, unspecified: Secondary | ICD-10-CM | POA: Diagnosis not present

## 2017-09-13 DIAGNOSIS — F17210 Nicotine dependence, cigarettes, uncomplicated: Secondary | ICD-10-CM

## 2017-09-13 DIAGNOSIS — F330 Major depressive disorder, recurrent, mild: Secondary | ICD-10-CM

## 2017-09-13 DIAGNOSIS — Z818 Family history of other mental and behavioral disorders: Secondary | ICD-10-CM | POA: Diagnosis not present

## 2017-09-13 DIAGNOSIS — F419 Anxiety disorder, unspecified: Secondary | ICD-10-CM

## 2017-09-13 MED ORDER — ALPRAZOLAM 1 MG PO TABS: 1.0000 mg | ORAL_TABLET | Freq: Two times a day (BID) | ORAL | 2 refills | Status: DC

## 2017-09-13 MED ORDER — BUPROPION HCL ER (XL) 150 MG PO TB24: 150.0000 mg | ORAL_TABLET | Freq: Every day | ORAL | 2 refills | Status: DC

## 2017-09-13 NOTE — Progress Notes (Signed)
[name] MD/PA/NP OP Progress Note  09/13/2017 10:01 AM Jackie Goodman  MRN:  [id number]  Chief Complaint: I am doing good.  I had a good Christmas.  HPI: Patient came for his follow-up appointment.  He is taking her medication as prescribed.  She is happy as she started going to church.  She enjoys babysitting her 63-year-old grandson.  Patient denies any irritability, anger, mania, psychosis or any hallucination.  She is scheduled to see her nephrologist on 29th and her rheumatologist in February.  Sometimes she complains knee pain and she is hoping to get an earlier appointment to see rheumatologist.  Patient denies drinking alcohol or using any senses.  She had a very good Christmas with the family.  Patient denies any issues or any concerns about the medication.  Her energy level is good.  Her appetite is okay.    Visit Diagnosis:    ICD-10-CM   1. Mild episode of recurrent major depressive disorder (HCC) F33.0 buPROPion (WELLBUTRIN XL) 150 MG 24 hr tablet    ALPRAZolam (XANAX) 1 MG tablet    Past Psychiatric History: Viewed Patient has depression since 1993 when she was diagnosed with lupus. She was seeing psychiatrist at [facility] clinic however she was not happy and decided to change her psychiatrist. She is seen in this office since April 2009. In the past she has taken Zoloft. Patient denies any inpatient psychiatric treatment, paranoia, hallucination or any suicidal thoughts.  Past Medical History:  Past Medical History:  Diagnosis Date  . CRI (chronic renal insufficiency)   . Headache(784.0)   . HTN (hypertension)   . Lupus   . Myositis   . Rosacea     Past Surgical History:  Procedure Laterality Date  . ABDOMINAL HYSTERECTOMY    . TONSILLECTOMY      Family Psychiatric History: Reviewed.  Family History:  Family History  Problem Relation Age of Onset  . Depression Brother     Social History:  Social History   Socioeconomic History  . Marital status: Married    Spouse  name: None  . Number of children: None  . Years of education: None  . Highest education level: None  Social Needs  . Financial resource strain: None  . Food insecurity - worry: None  . Food insecurity - inability: None  . Transportation needs - medical: None  . Transportation needs - non-medical: None  Occupational History  . None  Tobacco Use  . Smoking status: Current Every Day Smoker    Packs/day: 0.25    Years: 10.00    Pack years: 2.50  . Smokeless tobacco: Never Used  Substance and Sexual Activity  . Alcohol use: No    Alcohol/week: 0.0 oz  . Drug use: No  . Sexual activity: Yes    Partners: Male    Birth control/protection: Condom  Other Topics Concern  . None  Social History Narrative  . None    Allergies:  Allergies  Allergen Reactions  . Amoxicillin Anaphylaxis  . Sulfa Antibiotics Anaphylaxis  . Tetracyclines & Related Swelling  . Vicodin [Hydrocodone-Acetaminophen] Itching    Metabolic Disorder Labs: No results found for: HGBA1C, MPG No results found for: PROLACTIN No results found for: CHOL, TRIG, HDL, CHOLHDL, VLDL, LDLCALC No results found for: TSH  Therapeutic Level Labs: No results found for: LITHIUM No results found for: VALPROATE No components found for:  CBMZ  Current Medications: Current Outpatient Medications  Medication Sig Dispense Refill  . ALPRAZolam (XANAX) 1 MG tablet Take  1 tablet (1 mg total) by mouth 2 (two) times daily. 10 tablet 0  . buPROPion (WELLBUTRIN XL) 150 MG 24 hr tablet Take 1 tablet (150 mg total) by mouth daily. 30 tablet 2  . clobetasol (TEMOVATE) 0.05 % external solution Apply 1 application topically 2 (two) times daily.     . fluticasone (FLONASE) 50 MCG/ACT nasal spray Place 1 spray into the nose daily.     . hydroxychloroquine (PLAQUENIL) 200 MG tablet Take 200 mg by mouth 2 (two) times daily.     Marland Kitchen omeprazole (PRILOSEC) 20 MG capsule Take 20 mg by mouth 2 (two) times daily before a meal.    . traMADol  (ULTRAM) 50 MG tablet take 1 tablet by mouth every 8 hours if needed for pain  0   No current facility-administered medications for this visit.      Musculoskeletal: Strength & Muscle Tone: within normal limits Gait & Station: normal Patient leans: N/A  Psychiatric Specialty Exam: Review of Systems  Musculoskeletal: Positive for back pain and joint pain.    Blood pressure 126/70, pulse 80, height 5\' 5"  (1.651 m), weight 164 lb (74.4 kg).Body mass index is 27.29 kg/m.  General Appearance: Casual  Eye Contact:  Good  Speech:  Clear and Coherent  Volume:  Normal  Mood:  Euthymic  Affect:  Appropriate  Thought Process:  Goal Directed  Orientation:  Full (Time, Place, and Person)  Thought Content: Logical   Suicidal Thoughts:  No  Homicidal Thoughts:  No  Memory:  Immediate;   Good Recent;   Good Remote;   Good  Judgement:  Good  Insight:  Good  Psychomotor Activity:  Normal  Concentration:  Concentration: Good and Attention Span: Good  Recall:  Good  Fund of Knowledge: Good  Language: Good  Akathisia:  No  Handed:  Right  AIMS (if indicated): not done  Assets:  Communication Skills Desire for Improvement Housing Social Support  ADL's:  Intact  Cognition: WNL  Sleep:  Good   Screenings:   Assessment and Plan: Major depressive disorder, recurrent.  Anxiety disorder NOS.  Patient is a stable on her current psychiatric medication.  Continue Wellbutrin XL 150 mg daily and Xanax 1 mg twice a day.  Discussed benzodiazepine dependence, tolerance and withdrawal.  Recommended to call us back if she has any question or any concern.  Follow-up in 3 months.   Kathlee Nations, MD 09/13/2017, 10:01 AM

## 2017-09-30 ENCOUNTER — Other Ambulatory Visit (HOSPITAL_COMMUNITY): Payer: Self-pay

## 2017-09-30 DIAGNOSIS — F330 Major depressive disorder, recurrent, mild: Secondary | ICD-10-CM

## 2017-09-30 MED ORDER — ALPRAZOLAM 1 MG PO TABS: 1.0000 mg | ORAL_TABLET | Freq: Two times a day (BID) | ORAL | 2 refills | Status: DC

## 2017-11-10 DIAGNOSIS — N183 Chronic kidney disease, stage 3 (moderate): Secondary | ICD-10-CM | POA: Diagnosis not present

## 2017-11-10 DIAGNOSIS — M3219 Other organ or system involvement in systemic lupus erythematosus: Secondary | ICD-10-CM | POA: Diagnosis not present

## 2017-11-10 DIAGNOSIS — I10 Essential (primary) hypertension: Secondary | ICD-10-CM | POA: Diagnosis not present

## 2017-12-08 DIAGNOSIS — Z79899 Other long term (current) drug therapy: Secondary | ICD-10-CM | POA: Diagnosis not present

## 2017-12-08 DIAGNOSIS — H25813 Combined forms of age-related cataract, bilateral: Secondary | ICD-10-CM | POA: Diagnosis not present

## 2017-12-14 ENCOUNTER — Ambulatory Visit (INDEPENDENT_AMBULATORY_CARE_PROVIDER_SITE_OTHER): Payer: Medicare Other | Admitting: Psychiatry

## 2017-12-14 ENCOUNTER — Encounter (HOSPITAL_COMMUNITY): Payer: Self-pay | Admitting: Psychiatry

## 2017-12-14 DIAGNOSIS — F411 Generalized anxiety disorder: Secondary | ICD-10-CM

## 2017-12-14 DIAGNOSIS — F17210 Nicotine dependence, cigarettes, uncomplicated: Secondary | ICD-10-CM | POA: Diagnosis not present

## 2017-12-14 DIAGNOSIS — Z818 Family history of other mental and behavioral disorders: Secondary | ICD-10-CM

## 2017-12-14 DIAGNOSIS — F330 Major depressive disorder, recurrent, mild: Secondary | ICD-10-CM | POA: Diagnosis not present

## 2017-12-14 MED ORDER — ALPRAZOLAM 1 MG PO TABS: 1.0000 mg | ORAL_TABLET | Freq: Two times a day (BID) | ORAL | 2 refills | Status: DC

## 2017-12-14 MED ORDER — BUPROPION HCL ER (XL) 150 MG PO TB24
150.0000 mg | ORAL_TABLET | Freq: Every day | ORAL | 2 refills | Status: DC
Start: 2017-12-14 — End: 2018-03-30

## 2017-12-14 NOTE — Progress Notes (Signed)
BH MD/PA/NP OP Progress Note  12/14/2017 3:06 PM Jackie Goodman  MRN:  [id number]  Chief Complaint: I am doing better.  Recently I saw a kidney doctor because of increased creatinine.  HPI: Patient came for her follow-up appointment.  She is taking her medication as prescribed.  She denies any major panic attack but feel nervous and anxious.  Recently she seen nephrologist for creatinine which was 1.8.  There were no new medication added but recommended to drink enough water and follow-up in 3 months.  Overall she describes her irritability and mood is a stable.  She worried about her niece who recently had abdominal surgery and her aunt who have leg amputation.  Patient feel her current medicine is working very well.  She denies any crying spells, paranoia, hallucination or any anger issues.  She denies drinking alcohol or using any illegal substances.  She enjoys the company of her grandson.  She goes to church regularly.  Her appetite is okay.    Visit Diagnosis:    ICD-10-CM   1. Mild episode of recurrent major depressive disorder (HCC) F33.0 buPROPion (WELLBUTRIN XL) 150 MG 24 hr tablet    ALPRAZolam (XANAX) 1 MG tablet    Past Psychiatric History: Reviewed Patient has depression since 1993 when she was diagnosed with lupus. She was seeing psychiatrist at [facility] however she was not happy and decided to change her psychiatrist. She is seen in this office since April 2009. In the past she has taken Zoloft. Patient denies any inpatient psychiatric treatment, paranoia, hallucination or any suicidal thoughts.  Past Medical History:  Past Medical History:  Diagnosis Date  . CRI (chronic renal insufficiency)   . Headache(784.0)   . HTN (hypertension)   . Lupus   . Myositis   . Rosacea     Past Surgical History:  Procedure Laterality Date  . ABDOMINAL HYSTERECTOMY    . TONSILLECTOMY      Family Psychiatric History: Reviewed.  Family History:  Family History  Problem Relation  Age of Onset  . Depression Brother     Social History:  Social History   Socioeconomic History  . Marital status: Married    Spouse name: Not on file  . Number of children: Not on file  . Years of education: Not on file  . Highest education level: Not on file  Occupational History  . Not on file  Social Needs  . Financial resource strain: Not on file  . Food insecurity:    Worry: Not on file    Inability: Not on file  . Transportation needs:    Medical: Not on file    Non-medical: Not on file  Tobacco Use  . Smoking status: Current Every Day Smoker    Packs/day: 0.25    Years: 10.00    Pack years: 2.50  . Smokeless tobacco: Never Used  Substance and Sexual Activity  . Alcohol use: No    Alcohol/week: 0.0 oz  . Drug use: No  . Sexual activity: Yes    Partners: Male    Birth control/protection: Condom  Lifestyle  . Physical activity:    Days per week: Not on file    Minutes per session: Not on file  . Stress: Not on file  Relationships  . Social connections:    Talks on phone: Not on file    Gets together: Not on file    Attends religious service: Not on file    Active member of club or organization:  Not on file    Attends meetings of clubs or organizations: Not on file    Relationship status: Not on file  Other Topics Concern  . Not on file  Social History Narrative  . Not on file    Allergies:  Allergies  Allergen Reactions  . Amoxicillin Anaphylaxis  . Sulfa Antibiotics Anaphylaxis  . Tetracyclines & Related Swelling  . Vicodin [Hydrocodone-Acetaminophen] Itching    Metabolic Disorder Labs: No results found for: HGBA1C, MPG No results found for: PROLACTIN No results found for: CHOL, TRIG, HDL, CHOLHDL, VLDL, LDLCALC No results found for: TSH  Therapeutic Level Labs: No results found for: LITHIUM No results found for: VALPROATE No components found for:  CBMZ  Current Medications: Current Outpatient Medications  Medication Sig Dispense  Refill  . ALPRAZolam (XANAX) 1 MG tablet Take 1 tablet (1 mg total) by mouth 2 (two) times daily. 60 tablet 2  . buPROPion (WELLBUTRIN XL) 150 MG 24 hr tablet Take 1 tablet (150 mg total) by mouth daily. 30 tablet 2  . clobetasol (TEMOVATE) 0.05 % external solution Apply 1 application topically 2 (two) times daily.     . fluticasone (FLONASE) 50 MCG/ACT nasal spray Place 1 spray into the nose daily.     . hydroxychloroquine (PLAQUENIL) 200 MG tablet Take 200 mg by mouth 2 (two) times daily.     Marland Kitchen omeprazole (PRILOSEC) 20 MG capsule Take 20 mg by mouth 2 (two) times daily before a meal.    . traMADol (ULTRAM) 50 MG tablet take 1 tablet by mouth every 8 hours if needed for pain  0   No current facility-administered medications for this visit.      Musculoskeletal: Strength & Muscle Tone: within normal limits Gait & Station: normal Patient leans: N/A  Psychiatric Specialty Exam: ROS  Blood pressure 122/76, pulse (!) 58, height 5\' 5"  (1.651 m), weight 158 lb (71.7 kg), SpO2 97 %.Body mass index is 26.29 kg/m.  General Appearance: Casual  Eye Contact:  Good  Speech:  Clear and Coherent  Volume:  Normal  Mood:  Anxious  Affect:  Appropriate  Thought Process:  Goal Directed  Orientation:  Full (Time, Place, and Person)  Thought Content: Logical   Suicidal Thoughts:  No  Homicidal Thoughts:  No  Memory:  Immediate;   Good Recent;   Good Remote;   Good  Judgement:  Good  Insight:  Good  Psychomotor Activity:  Normal  Concentration:  Concentration: Good and Attention Span: Good  Recall:  Good  Fund of Knowledge: Good  Language: Good  Akathisia:  No  Handed:  Right  AIMS (if indicated): not done  Assets:  Communication Skills Desire for Improvement Housing Resilience Social Support  ADL's:  Intact  Cognition: WNL  Sleep:  Good   Screenings:   Assessment and Plan: Major depressive disorder, recurrent.  Generalized anxiety disorder.  Reassurance given.  Recommended to  continue Wellbutrin XL 150 mg daily and Xanax 1 mg twice a day.  Discussed benzodiazepine dependence tolerance and withdrawal.  Encouraged to keep appointment with nephrologist.  Discussed healthy lifestyle.  Patient is not interested in counseling.  Recommended to call us back if she has any question, concern if you feel worsening of the symptoms.  Follow-up in 3 months.   Kathlee Nations, MD 12/14/2017, 3:06 PM

## 2017-12-29 DIAGNOSIS — I10 Essential (primary) hypertension: Secondary | ICD-10-CM | POA: Diagnosis not present

## 2017-12-29 DIAGNOSIS — N183 Chronic kidney disease, stage 3 (moderate): Secondary | ICD-10-CM | POA: Diagnosis not present

## 2018-03-15 ENCOUNTER — Ambulatory Visit (HOSPITAL_COMMUNITY): Payer: Self-pay | Admitting: Psychiatry

## 2018-03-24 ENCOUNTER — Other Ambulatory Visit (HOSPITAL_COMMUNITY): Payer: Self-pay

## 2018-03-24 DIAGNOSIS — F330 Major depressive disorder, recurrent, mild: Secondary | ICD-10-CM

## 2018-03-24 MED ORDER — ALPRAZOLAM 1 MG PO TABS: 1.0000 mg | ORAL_TABLET | Freq: Two times a day (BID) | ORAL | 1 refills | Status: DC

## 2018-03-30 ENCOUNTER — Ambulatory Visit (INDEPENDENT_AMBULATORY_CARE_PROVIDER_SITE_OTHER): Payer: Medicare Other | Admitting: Psychiatry

## 2018-03-30 ENCOUNTER — Encounter (HOSPITAL_COMMUNITY): Payer: Self-pay | Admitting: Psychiatry

## 2018-03-30 DIAGNOSIS — F17210 Nicotine dependence, cigarettes, uncomplicated: Secondary | ICD-10-CM | POA: Diagnosis not present

## 2018-03-30 DIAGNOSIS — F330 Major depressive disorder, recurrent, mild: Secondary | ICD-10-CM

## 2018-03-30 MED ORDER — BUPROPION HCL ER (XL) 150 MG PO TB24: 150.0000 mg | ORAL_TABLET | Freq: Every day | ORAL | 0 refills | Status: DC

## 2018-03-30 NOTE — Progress Notes (Signed)
[facility] MD/PA/NP OP Progress Note  03/30/2018 11:23 AM Jackie Goodman  MRN:  [id number]  Chief Complaint: Doing really well HPI: Jackie Goodman reports that she feels really good on the current medication regimen, takes the Wellbutrin as prescribed and Xanax twice a day.  I cautioned her about the long-term use of benzodiazepines and she feels like the risks are outweighed by the benefits at this time.  No acute concerns otherwise.  Spent some time discussing some of her hobbies and activities that she does for fun, and she has a good support system in family network locally.  Visit Diagnosis:    ICD-10-CM   1. Mild episode of recurrent major depressive disorder (HCC) F33.0 buPROPion (WELLBUTRIN XL) 150 MG 24 hr tablet    Past Psychiatric History: See intake H&P for full details. Reviewed, with no updates at this time.   Past Medical History:  Past Medical History:  Diagnosis Date  . CRI (chronic renal insufficiency)   . Headache(784.0)   . HTN (hypertension)   . Lupus ([place])   . Myositis   . Rosacea     Past Surgical History:  Procedure Laterality Date  . ABDOMINAL HYSTERECTOMY    . TONSILLECTOMY      Family Psychiatric History: See intake H&P for full details. Reviewed, with no updates at this time.   Family History:  Family History  Problem Relation Age of Onset  . Depression Brother     Social History:  Social History   Socioeconomic History  . Marital status: Married    Spouse name: Not on file  . Number of children: Not on file  . Years of education: Not on file  . Highest education level: Not on file  Occupational History  . Not on file  Social Needs  . Financial resource strain: Not on file  . Food insecurity:    Worry: Not on file    Inability: Not on file  . Transportation needs:    Medical: Not on file    Non-medical: Not on file  Tobacco Use  . Smoking status: Current Every Day Smoker    Packs/day: 0.25    Years: 10.00    Pack years: 2.50  .  Smokeless tobacco: Never Used  Substance and Sexual Activity  . Alcohol use: No    Alcohol/week: 0.0 oz  . Drug use: No  . Sexual activity: Yes    Partners: Male    Birth control/protection: Condom  Lifestyle  . Physical activity:    Days per week: Not on file    Minutes per session: Not on file  . Stress: Not on file  Relationships  . Social connections:    Talks on phone: Not on file    Gets together: Not on file    Attends religious service: Not on file    Active member of club or organization: Not on file    Attends meetings of clubs or organizations: Not on file    Relationship status: Not on file  Other Topics Concern  . Not on file  Social History Narrative  . Not on file    Allergies:  Allergies  Allergen Reactions  . Amoxicillin Anaphylaxis  . Sulfa Antibiotics Anaphylaxis  . Tetracyclines & Related Swelling  . Vicodin [Hydrocodone-Acetaminophen] Itching    Metabolic Disorder Labs: No results found for: HGBA1C, MPG No results found for: PROLACTIN No results found for: CHOL, TRIG, HDL, CHOLHDL, VLDL, LDLCALC No results found for: TSH  Therapeutic Level Labs: No results  found for: LITHIUM No results found for: VALPROATE No components found for:  CBMZ  Current Medications: Current Outpatient Medications  Medication Sig Dispense Refill  . ALPRAZolam (XANAX) 1 MG tablet Take 1 tablet (1 mg total) by mouth 2 (two) times daily. 60 tablet 1  . buPROPion (WELLBUTRIN XL) 150 MG 24 hr tablet Take 1 tablet (150 mg total) by mouth daily. 90 tablet 0  . clobetasol (TEMOVATE) 0.05 % external solution Apply 1 application topically 2 (two) times daily.     . fluticasone (FLONASE) 50 MCG/ACT nasal spray Place 1 spray into the nose daily.     . hydroxychloroquine (PLAQUENIL) 200 MG tablet Take 200 mg by mouth 2 (two) times daily.     Marland Kitchen omeprazole (PRILOSEC) 20 MG capsule Take 20 mg by mouth 2 (two) times daily before a meal.    . traMADol (ULTRAM) 50 MG tablet take 1  tablet by mouth every 8 hours if needed for pain  0   No current facility-administered medications for this visit.      Musculoskeletal: Strength & Muscle Tone: within normal limits Gait & Station: normal Patient leans: N/A  Psychiatric Specialty Exam: ROS  Blood pressure (!) 155/91, pulse (!) 45, height 5\' 5"  (1.651 m), weight 159 lb (72.1 kg), SpO2 96 %.Body mass index is 26.46 kg/m.  General Appearance: Casual and Well Groomed  Eye Contact:  Good  Speech:  Clear and Coherent and Normal Rate  Volume:  Normal  Mood:  Euthymic  Affect:  Appropriate and Congruent  Thought Process:  Coherent and Descriptions of Associations: Intact  Orientation:  Full (Time, Place, and Person)  Thought Content: Logical   Suicidal Thoughts:  No  Homicidal Thoughts:  No  Memory:  Immediate;   Good  Judgement:  Good  Insight:  Good  Psychomotor Activity:  Normal  Concentration:  Concentration: Good  Recall:  Good  Fund of Knowledge: Good  Language: Good  Akathisia:  Negative  Handed:  Right  AIMS (if indicated): not done  Assets:  Communication Skills Desire for Improvement Financial Resources/Insurance Housing  ADL's:  Intact  Cognition: WNL  Sleep:  Good   Screenings:   Assessment and Plan:  Laurette Villescas presents with euthymic mood, tolerating medication regimen without any concerns.  Routine medication refills sent to pharmacy, patient should follow-up with Dr. Adele Schilder in 3 months.  No acute safety concerns at this time.  1. Mild episode of recurrent major depressive disorder ([place])     Status of current problems: stable  Labs Ordered: No orders of the defined types were placed in this encounter.   Labs Reviewed: NA  Collateral Obtained/Records Reviewed: Dr. Adele Schilder prior notes  Plan:  Continue Wellbutrin 150 mg XL Continue Xanax 1 mg twice a day as needed Educated patient about the long-term deleterious effects of benzodiazepines RTC 3 months    Jackie Dubin, MD 03/30/2018, 11:23 AM

## 2018-05-19 ENCOUNTER — Other Ambulatory Visit (HOSPITAL_COMMUNITY): Payer: Self-pay

## 2018-05-19 DIAGNOSIS — F330 Major depressive disorder, recurrent, mild: Secondary | ICD-10-CM

## 2018-05-19 MED ORDER — ALPRAZOLAM 1 MG PO TABS
1.0000 mg | ORAL_TABLET | Freq: Two times a day (BID) | ORAL | 0 refills | Status: DC
Start: 2018-05-19 — End: 2018-06-06

## 2018-06-01 ENCOUNTER — Ambulatory Visit (HOSPITAL_COMMUNITY): Payer: Self-pay | Admitting: Psychiatry

## 2018-06-06 ENCOUNTER — Encounter (HOSPITAL_COMMUNITY): Payer: Self-pay | Admitting: Psychiatry

## 2018-06-06 ENCOUNTER — Ambulatory Visit (INDEPENDENT_AMBULATORY_CARE_PROVIDER_SITE_OTHER): Payer: Medicare Other | Admitting: Psychiatry

## 2018-06-06 VITALS — BP 130/76 | Ht 65.0 in | Wt 161.0 lb

## 2018-06-06 DIAGNOSIS — F17210 Nicotine dependence, cigarettes, uncomplicated: Secondary | ICD-10-CM | POA: Diagnosis not present

## 2018-06-06 DIAGNOSIS — F411 Generalized anxiety disorder: Secondary | ICD-10-CM

## 2018-06-06 DIAGNOSIS — F330 Major depressive disorder, recurrent, mild: Secondary | ICD-10-CM

## 2018-06-06 MED ORDER — ALPRAZOLAM 1 MG PO TABS: 1.0000 mg | ORAL_TABLET | Freq: Two times a day (BID) | ORAL | 2 refills | Status: DC

## 2018-06-06 MED ORDER — BUPROPION HCL ER (XL) 150 MG PO TB24: 150.0000 mg | ORAL_TABLET | Freq: Every day | ORAL | 0 refills | Status: DC

## 2018-06-06 NOTE — Progress Notes (Signed)
BH MD/PA/NP OP Progress Note  06/06/2018 3:02 PM Jackie Goodman  MRN:  [id number]  Chief Complaint: I am doing better but sometime I have difficulty handling my 63 year old grand child who has ADHD.  HPI: Patient came for her follow-up appointment.  She is taking her medication as prescribed denies any side effects.  However she endorsed lately have difficulty handling her 20 year old grandchild who has ADHD.  Patient told that her daughter does not discipline him in some time he gets out of control.  Recently he was sent back from school due to behavioral problem.  Patient told her grandchild is taking ADHD medication but sometime she feels they are not working.  Patient sleeping good.  She denies any drinking or using any illegal substances.  She denies any paranoia, hallucination, suicidal thoughts or homicidal thought.  She goes to church regularly.  Her appetite is okay.  Her energy level is good.  She recently seen her physician at [facility] and she was told everything is a stable.  Patient like to continue her current psychiatric medication.  Patient denies any major panic attack.  Visit Diagnosis:    ICD-10-CM   1. Mild episode of recurrent major depressive disorder (HCC) F33.0 ALPRAZolam (XANAX) 1 MG tablet    buPROPion (WELLBUTRIN XL) 150 MG 24 hr tablet  2. GAD (generalized anxiety disorder) F41.1 ALPRAZolam (XANAX) 1 MG tablet    Past Psychiatric History: Viewed Patient has depression since 1993 when she was diagnosed with lupus. She was seeing psychiatrist at [facility] clinic however she was not happy and decided to change her psychiatrist. She is seen in this office since April 2009. In the past she has taken Zoloft. Patient denies any inpatient psychiatric treatment, paranoia, hallucination or any suicidal thoughts.  Past Medical History:  Past Medical History:  Diagnosis Date  . CRI (chronic renal insufficiency)   . Headache(784.0)   . HTN (hypertension)   . Lupus ([place])   . Myositis    . Rosacea     Past Surgical History:  Procedure Laterality Date  . ABDOMINAL HYSTERECTOMY    . TONSILLECTOMY      Family Psychiatric History: Viewed  Family History:  Family History  Problem Relation Age of Onset  . Depression Brother     Social History:  Social History   Socioeconomic History  . Marital status: Married    Spouse name: Not on file  . Number of children: Not on file  . Years of education: Not on file  . Highest education level: Not on file  Occupational History  . Not on file  Social Needs  . Financial resource strain: Not on file  . Food insecurity:    Worry: Not on file    Inability: Not on file  . Transportation needs:    Medical: Not on file    Non-medical: Not on file  Tobacco Use  . Smoking status: Current Every Day Smoker    Packs/day: 0.25    Years: 10.00    Pack years: 2.50  . Smokeless tobacco: Never Used  Substance and Sexual Activity  . Alcohol use: No    Alcohol/week: 0.0 standard drinks  . Drug use: No  . Sexual activity: Yes    Partners: Male    Birth control/protection: Condom  Lifestyle  . Physical activity:    Days per week: Not on file    Minutes per session: Not on file  . Stress: Not on file  Relationships  . Social connections:  Talks on phone: Not on file    Gets together: Not on file    Attends religious service: Not on file    Active member of club or organization: Not on file    Attends meetings of clubs or organizations: Not on file    Relationship status: Not on file  Other Topics Concern  . Not on file  Social History Narrative  . Not on file    Allergies:  Allergies  Allergen Reactions  . Amoxicillin Anaphylaxis  . Sulfa Antibiotics Anaphylaxis  . Tetracyclines & Related Swelling  . Vicodin [Hydrocodone-Acetaminophen] Itching    Metabolic Disorder Labs: No results found for: HGBA1C, MPG No results found for: PROLACTIN No results found for: CHOL, TRIG, HDL, CHOLHDL, VLDL, LDLCALC No  results found for: TSH  Therapeutic Level Labs: No results found for: LITHIUM No results found for: VALPROATE No components found for:  CBMZ  Current Medications: Current Outpatient Medications  Medication Sig Dispense Refill  . ALPRAZolam (XANAX) 1 MG tablet Take 1 tablet (1 mg total) by mouth 2 (two) times daily. 60 tablet 0  . buPROPion (WELLBUTRIN XL) 150 MG 24 hr tablet Take 1 tablet (150 mg total) by mouth daily. 90 tablet 0  . clobetasol (TEMOVATE) 0.05 % external solution Apply 1 application topically 2 (two) times daily.     . fluticasone (FLONASE) 50 MCG/ACT nasal spray Place 1 spray into the nose daily.     . hydroxychloroquine (PLAQUENIL) 200 MG tablet Take 200 mg by mouth 2 (two) times daily.     Marland Kitchen omeprazole (PRILOSEC) 20 MG capsule Take 20 mg by mouth 2 (two) times daily before a meal.    . traMADol (ULTRAM) 50 MG tablet take 1 tablet by mouth every 8 hours if needed for pain  0   No current facility-administered medications for this visit.      Musculoskeletal: Strength & Muscle Tone: within normal limits Gait & Station: normal Patient leans: N/A  Psychiatric Specialty Exam: ROS  There were no vitals taken for this visit.There is no height or weight on file to calculate BMI.  General Appearance: Casual  Eye Contact:  Good  Speech:  Clear and Coherent  Volume:  Normal  Mood:  Anxious  Affect:  Appropriate  Thought Process:  Goal Directed  Orientation:  Full (Time, Place, and Person)  Thought Content: WDL   Suicidal Thoughts:  No  Homicidal Thoughts:  No  Memory:  Immediate;   Good Recent;   Good Remote;   Good  Judgement:  Good  Insight:  Good  Psychomotor Activity:  Normal  Concentration:  Concentration: Good and Attention Span: Good  Recall:  Good  Fund of Knowledge: Good  Language: Good  Akathisia:  No  Handed:  Right  AIMS (if indicated): not done  Assets:  Communication Skills Desire for Improvement Housing Resilience Social Support   ADL's:  Intact  Cognition: WNL  Sleep:  Fair   Screenings:   Assessment and Plan: Depressive disorder, recurrent.  Generalized anxiety disorder.  Reassurance given.  Recommended to have her grandchild seen by psychiatrist for medication adjustment.  Patient promised that if she need child psychiatrist then she will call us.  Also will continue Wellbutrin XL 150 mg daily and Xanax 1 mg twice a day.  She has no rash, itching tremors or any shakes.  Discussed benzodiazepine dependence tolerance and withdrawal.  Patient does not ask early refills for her prescription.  Recommended to call us back if she has  any question or any concern.  Follow-up in 3 months.   Kathlee Nations, MD 06/06/2018, 3:02 PM

## 2018-06-21 ENCOUNTER — Other Ambulatory Visit (HOSPITAL_COMMUNITY): Payer: Self-pay | Admitting: Psychiatry

## 2018-06-21 DIAGNOSIS — F330 Major depressive disorder, recurrent, mild: Secondary | ICD-10-CM

## 2018-06-21 DIAGNOSIS — F411 Generalized anxiety disorder: Secondary | ICD-10-CM

## 2018-07-14 DIAGNOSIS — Z23 Encounter for immunization: Secondary | ICD-10-CM | POA: Diagnosis not present

## 2018-07-14 DIAGNOSIS — I10 Essential (primary) hypertension: Secondary | ICD-10-CM | POA: Diagnosis not present

## 2018-07-14 DIAGNOSIS — M3214 Glomerular disease in systemic lupus erythematosus: Secondary | ICD-10-CM | POA: Diagnosis not present

## 2018-07-14 DIAGNOSIS — R809 Proteinuria, unspecified: Secondary | ICD-10-CM | POA: Diagnosis not present

## 2018-07-14 DIAGNOSIS — N183 Chronic kidney disease, stage 3 (moderate): Secondary | ICD-10-CM | POA: Diagnosis not present

## 2018-07-14 DIAGNOSIS — I129 Hypertensive chronic kidney disease with stage 1 through stage 4 chronic kidney disease, or unspecified chronic kidney disease: Secondary | ICD-10-CM | POA: Diagnosis not present

## 2018-09-06 ENCOUNTER — Ambulatory Visit (INDEPENDENT_AMBULATORY_CARE_PROVIDER_SITE_OTHER): Payer: Medicare Other | Admitting: Psychiatry

## 2018-09-06 ENCOUNTER — Encounter (HOSPITAL_COMMUNITY): Payer: Self-pay | Admitting: Psychiatry

## 2018-09-06 DIAGNOSIS — F411 Generalized anxiety disorder: Secondary | ICD-10-CM | POA: Diagnosis not present

## 2018-09-06 DIAGNOSIS — F330 Major depressive disorder, recurrent, mild: Secondary | ICD-10-CM

## 2018-09-06 MED ORDER — ALPRAZOLAM 1 MG PO TABS: 1.0000 mg | ORAL_TABLET | Freq: Two times a day (BID) | ORAL | 2 refills | Status: DC

## 2018-09-06 MED ORDER — BUPROPION HCL ER (XL) 150 MG PO TB24: 150.0000 mg | ORAL_TABLET | Freq: Every day | ORAL | 0 refills | Status: DC

## 2018-09-06 NOTE — Progress Notes (Signed)
[name] MD/PA/NP OP Progress Note  09/06/2018 10:41 AM Jackie Goodman  MRN:  [id number]  Chief Complaint: I am doing good.  I had a good Christmas.  HPI: Jackie Goodman came for her follow-up appointment.  She is compliant with Xanax and Wellbutrin.  She has no side effects including any tremors or shakes.  Her Christmas was very good.  She was able to spend time with her daughter and grandchildren.  She is pleased that her 64 year old grand child who has ADHD is doing much better.  There has been no new complaint from the school.  Patient ported that her daughter is also trying to have a better discipline at home.  Patient sleeping good.  She denies drinking or using any illegal substances.  Recently she seen her physician for lupus in November at [facility].  Her creatinine is 1.4 stable.  Patient denies any paranoia, hallucination or any suicidal thoughts.  She denies any major panic attack.  Her energy level is good.  Her sleep is okay.  Her vital signs are stable.  Visit Diagnosis:    ICD-10-CM   1. Mild episode of recurrent major depressive disorder (HCC) F33.0 ALPRAZolam (XANAX) 1 MG tablet    buPROPion (WELLBUTRIN XL) 150 MG 24 hr tablet  2. GAD (generalized anxiety disorder) F41.1 ALPRAZolam (XANAX) 1 MG tablet    Past Psychiatric History: Reviewed. History of depression since diagnosed with lupus in 1993.  Saw psychiatrist at [facility] but not happy with the care.  Seeing this Probation officer since April 2009.  Tried Zoloft in the past.  No history of psychiatric inpatient treatment, paranoia or any suicidal attempt.  Past Medical History:  Past Medical History:  Diagnosis Date  . CRI (chronic renal insufficiency)   . Headache(784.0)   . HTN (hypertension)   . Lupus ([place])   . Myositis   . Rosacea     Past Surgical History:  Procedure Laterality Date  . ABDOMINAL HYSTERECTOMY    . TONSILLECTOMY      Family Psychiatric History: Reviewed.  Family History:  Family History  Problem Relation Age of Onset  .  Depression Brother     Social History:  Social History   Socioeconomic History  . Marital status: Married    Spouse name: Not on file  . Number of children: Not on file  . Years of education: Not on file  . Highest education level: Not on file  Occupational History  . Not on file  Social Needs  . Financial resource strain: Not on file  . Food insecurity:    Worry: Not on file    Inability: Not on file  . Transportation needs:    Medical: Not on file    Non-medical: Not on file  Tobacco Use  . Smoking status: Current Every Day Smoker    Packs/day: 0.25    Years: 10.00    Pack years: 2.50  . Smokeless tobacco: Never Used  Substance and Sexual Activity  . Alcohol use: No    Alcohol/week: 0.0 standard drinks  . Drug use: No  . Sexual activity: Yes    Partners: Male    Birth control/protection: Condom  Lifestyle  . Physical activity:    Days per week: Not on file    Minutes per session: Not on file  . Stress: Not on file  Relationships  . Social connections:    Talks on phone: Not on file    Gets together: Not on file    Attends religious service: Not  on file    Active member of club or organization: Not on file    Attends meetings of clubs or organizations: Not on file    Relationship status: Not on file  Other Topics Concern  . Not on file  Social History Narrative  . Not on file    Allergies:  Allergies  Allergen Reactions  . Amoxicillin Anaphylaxis  . Sulfa Antibiotics Anaphylaxis  . Tetracyclines & Related Swelling  . Vicodin [Hydrocodone-Acetaminophen] Itching    Metabolic Disorder Labs: No results found for: HGBA1C, MPG No results found for: PROLACTIN No results found for: CHOL, TRIG, HDL, CHOLHDL, VLDL, LDLCALC No results found for: TSH  Therapeutic Level Labs: No results found for: LITHIUM No results found for: VALPROATE No components found for:  CBMZ  Current Medications: Current Outpatient Medications  Medication Sig Dispense Refill   . ALPRAZolam (XANAX) 1 MG tablet Take 1 tablet (1 mg total) by mouth 2 (two) times daily. 60 tablet 2  . buPROPion (WELLBUTRIN XL) 150 MG 24 hr tablet Take 1 tablet (150 mg total) by mouth daily. 90 tablet 0  . clobetasol (TEMOVATE) 0.05 % external solution Apply 1 application topically 2 (two) times daily.     . fluticasone (FLONASE) 50 MCG/ACT nasal spray Place 1 spray into the nose daily.     . hydroxychloroquine (PLAQUENIL) 200 MG tablet Take 200 mg by mouth 2 (two) times daily.     Marland Kitchen omeprazole (PRILOSEC) 20 MG capsule Take 20 mg by mouth 2 (two) times daily before a meal.    . traMADol (ULTRAM) 50 MG tablet take 1 tablet by mouth every 8 hours if needed for pain  0   No current facility-administered medications for this visit.      Musculoskeletal: Strength & Muscle Tone: within normal limits Gait & Station: normal Patient leans: N/A  Psychiatric Specialty Exam: ROS  Blood pressure 126/78, pulse 74, height 5\' 4"  (1.626 m), weight 163 lb 12.8 oz (74.3 kg).Body mass index is 28.12 kg/m.  General Appearance: Casual  Eye Contact:  Good  Speech:  Clear and Coherent  Volume:  Normal  Mood:  Angry  Affect:  Congruent  Thought Process:  Goal Directed  Orientation:  Full (Time, Place, and Person)  Thought Content: Logical   Suicidal Thoughts:  No  Homicidal Thoughts:  No  Memory:  Immediate;   Good Recent;   Good Remote;   Good  Judgement:  Good  Insight:  Good  Psychomotor Activity:  Normal  Concentration:  Concentration: Good and Attention Span: Good  Recall:  Good  Fund of Knowledge: Good  Language: Good  Akathisia:  No  Handed:  Right  AIMS (if indicated): not done  Assets:  Communication Skills Desire for Improvement Housing Resilience Social Support  ADL's:  Intact  Cognition: WNL  Sleep:  Good   Screenings:   Assessment and Plan: Major depressive disorder, recurrent.  Generalized anxiety disorder.  Patient is doing good and stable on her current  medication.  I reviewed blood work results.  Her creatinine is 1.4.  She is not interested in therapy.  Continue Wellbutrin XL 150 mg daily and Xanax 1 mg twice a day.  Discussed benzodiazepine dependence tolerance and withdrawal.  Recommended to call us back if she is any question or any concern.  Follow-up in 3 months.   Kathlee Nations, MD 09/06/2018, 10:41 AM

## 2018-12-06 ENCOUNTER — Other Ambulatory Visit: Payer: Self-pay

## 2018-12-06 ENCOUNTER — Ambulatory Visit (INDEPENDENT_AMBULATORY_CARE_PROVIDER_SITE_OTHER): Payer: Medicare Other | Admitting: Psychiatry

## 2018-12-06 DIAGNOSIS — F17210 Nicotine dependence, cigarettes, uncomplicated: Secondary | ICD-10-CM | POA: Diagnosis not present

## 2018-12-06 DIAGNOSIS — F330 Major depressive disorder, recurrent, mild: Secondary | ICD-10-CM | POA: Diagnosis not present

## 2018-12-06 DIAGNOSIS — F411 Generalized anxiety disorder: Secondary | ICD-10-CM | POA: Diagnosis not present

## 2018-12-06 MED ORDER — BUPROPION HCL ER (XL) 150 MG PO TB24: 150.0000 mg | ORAL_TABLET | Freq: Every day | ORAL | 0 refills | Status: DC

## 2018-12-06 MED ORDER — ALPRAZOLAM 1 MG PO TABS: 1.0000 mg | ORAL_TABLET | Freq: Two times a day (BID) | ORAL | 2 refills | Status: DC

## 2018-12-06 NOTE — Progress Notes (Signed)
Virtual Visit via Telephone Note  I connected with Jackie Goodman on 12/06/18 at 10:00 AM EDT by telephone and verified that I am speaking with the correct person using two identifiers.   I discussed the limitations, risks, security and privacy concerns of performing an evaluation and management service by telephone and the availability of in person appointments. I also discussed with the patient that there may be a patient responsible charge related to this service. The patient expressed understanding and agreed to proceed.   History of Present Illness: Patient was evaluated through phone session.  She is taking her medication as prescribed.  She is somewhat anxious due to pandemic coronavirus as she has to reschedule her appointment at [facility] but her anxiety is under control.  She is sleeping good.  She denies any crying spells or any feeling of hopelessness or worthlessness.  She has a hard time getting her Plaquenil which is back order.  However she is able to manage to get some until she is hoping to get from the pharmacy.  She reported her depression is a stable.  She takes Xanax twice a day which is helping her anxiety.  She is pleased that her family members are doing very well and staying home and safe.  She had a great granddaughter born 2 months ago and reported everything went well.  She denies any major panic attack.  She denies any paranoia or any hallucination.  She is not drinking or using any illegal substances.  She like to keep her medication as it is working well.    Past Psychiatric History: Reviewed. History of depression since diagnosed with lupus in 1993.  Saw psychiatrist at [facility] but not happy with the care.  Seeing this Probation officer since April 2009.  Tried Zoloft in the past.  No history of psychiatric inpatient treatment, paranoia or any suicidal attempt.  Observations/Objective: Limited mental status examination done on the phone.  Patient described her mood okay.  Her speech is  clear, coherent with normal tone and volume.  She denies any auditory or visual hallucination.  She denies any active or passive suicidal thoughts or homicidal thought.  Her attention and concentration is okay.  She does not appear to be distracted on the phone.  There were no delusions, paranoia present.  There were no flight of ideas or loose associations.  She is alert and oriented x3.  Her fund of knowledge is adequate.  Her cognition is intact.  Her insight judgment is okay.  Assessment and Plan: Major depressive disorder, recurrent.  Generalized anxiety disorder.  Patient is a stable on her current medication.  She has no tremors shakes or any EPS.  She is hoping to get appointment to see her lupus doctor at [facility] for follow-up and blood work.  I will continue Wellbutrin XL 150 mg daily and Xanax 1 mg twice a day.  Discussed benzodiazepine dependence tolerance and withdrawal.  Recommended to call us back if she is any question or any concern.  Follow-up in 3 months.  Follow Up Instructions:    I discussed the assessment and treatment plan with the patient. The patient was provided an opportunity to ask questions and all were answered. The patient agreed with the plan and demonstrated an understanding of the instructions.   The patient was advised to call back or seek an in-person evaluation if the symptoms worsen or if the condition fails to improve as anticipated.  I provided 20 minutes of non-face-to-face time during this encounter.  Kathlee Nations, MD

## 2018-12-13 DIAGNOSIS — M3320 Polymyositis, organ involvement unspecified: Secondary | ICD-10-CM | POA: Diagnosis not present

## 2018-12-13 DIAGNOSIS — R030 Elevated blood-pressure reading, without diagnosis of hypertension: Secondary | ICD-10-CM | POA: Diagnosis not present

## 2018-12-13 DIAGNOSIS — G35 Multiple sclerosis: Secondary | ICD-10-CM | POA: Diagnosis not present

## 2018-12-13 DIAGNOSIS — Z7189 Other specified counseling: Secondary | ICD-10-CM | POA: Diagnosis not present

## 2018-12-13 DIAGNOSIS — I349 Nonrheumatic mitral valve disorder, unspecified: Secondary | ICD-10-CM | POA: Diagnosis not present

## 2018-12-13 DIAGNOSIS — F330 Major depressive disorder, recurrent, mild: Secondary | ICD-10-CM | POA: Diagnosis not present

## 2018-12-13 DIAGNOSIS — M328 Other forms of systemic lupus erythematosus: Secondary | ICD-10-CM | POA: Diagnosis not present

## 2018-12-13 DIAGNOSIS — M05712 Rheumatoid arthritis with rheumatoid factor of left shoulder without organ or systems involvement: Secondary | ICD-10-CM | POA: Diagnosis not present

## 2018-12-13 DIAGNOSIS — Z13220 Encounter for screening for lipoid disorders: Secondary | ICD-10-CM | POA: Diagnosis not present

## 2018-12-13 DIAGNOSIS — Z131 Encounter for screening for diabetes mellitus: Secondary | ICD-10-CM | POA: Diagnosis not present

## 2018-12-27 DIAGNOSIS — G35 Multiple sclerosis: Secondary | ICD-10-CM | POA: Diagnosis not present

## 2018-12-27 DIAGNOSIS — M05712 Rheumatoid arthritis with rheumatoid factor of left shoulder without organ or systems involvement: Secondary | ICD-10-CM | POA: Diagnosis not present

## 2018-12-27 DIAGNOSIS — F330 Major depressive disorder, recurrent, mild: Secondary | ICD-10-CM | POA: Diagnosis not present

## 2019-02-09 ENCOUNTER — Other Ambulatory Visit: Payer: Self-pay | Admitting: Family Medicine

## 2019-02-09 ENCOUNTER — Ambulatory Visit
Admission: RE | Admit: 2019-02-09 | Discharge: 2019-02-09 | Disposition: A | Discharge status: Home / Self Care | Payer: Medicare Other | Source: Ambulatory Visit | Attending: Family Medicine | Admitting: Family Medicine

## 2019-02-09 DIAGNOSIS — R030 Elevated blood-pressure reading, without diagnosis of hypertension: Secondary | ICD-10-CM | POA: Diagnosis not present

## 2019-02-09 DIAGNOSIS — R52 Pain, unspecified: Secondary | ICD-10-CM

## 2019-02-09 DIAGNOSIS — M79602 Pain in left arm: Secondary | ICD-10-CM | POA: Diagnosis not present

## 2019-02-09 DIAGNOSIS — F330 Major depressive disorder, recurrent, mild: Secondary | ICD-10-CM | POA: Diagnosis not present

## 2019-02-09 DIAGNOSIS — L932 Other local lupus erythematosus: Secondary | ICD-10-CM | POA: Diagnosis not present

## 2019-02-09 DIAGNOSIS — G35 Multiple sclerosis: Secondary | ICD-10-CM | POA: Diagnosis not present

## 2019-03-02 DIAGNOSIS — K210 Gastro-esophageal reflux disease with esophagitis: Secondary | ICD-10-CM | POA: Diagnosis not present

## 2019-03-02 DIAGNOSIS — L932 Other local lupus erythematosus: Secondary | ICD-10-CM | POA: Diagnosis not present

## 2019-03-02 DIAGNOSIS — G35 Multiple sclerosis: Secondary | ICD-10-CM | POA: Diagnosis not present

## 2019-03-02 DIAGNOSIS — F330 Major depressive disorder, recurrent, mild: Secondary | ICD-10-CM | POA: Diagnosis not present

## 2019-03-07 ENCOUNTER — Ambulatory Visit (INDEPENDENT_AMBULATORY_CARE_PROVIDER_SITE_OTHER): Payer: Medicare Other | Admitting: Psychiatry

## 2019-03-07 ENCOUNTER — Encounter (HOSPITAL_COMMUNITY): Payer: Self-pay | Admitting: Psychiatry

## 2019-03-07 ENCOUNTER — Other Ambulatory Visit: Payer: Self-pay

## 2019-03-07 DIAGNOSIS — F330 Major depressive disorder, recurrent, mild: Secondary | ICD-10-CM

## 2019-03-07 DIAGNOSIS — F411 Generalized anxiety disorder: Secondary | ICD-10-CM

## 2019-03-07 MED ORDER — BUPROPION HCL ER (XL) 150 MG PO TB24: 150.0000 mg | ORAL_TABLET | Freq: Every day | ORAL | 0 refills | Status: DC

## 2019-03-07 MED ORDER — ALPRAZOLAM 1 MG PO TABS: 1.0000 mg | ORAL_TABLET | Freq: Two times a day (BID) | ORAL | 2 refills | Status: DC

## 2019-03-07 NOTE — Progress Notes (Signed)
Virtual Visit via Telephone Note  I connected with Jackie Goodman on 03/07/19 at 11:40 AM EDT by telephone and verified that I am speaking with the correct person using two identifiers.   I discussed the limitations, risks, security and privacy concerns of performing an evaluation and management service by telephone and the availability of in person appointments. I also discussed with the patient that there may be a patient responsible charge related to this service. The patient expressed understanding and agreed to proceed.   History of Present Illness: Patient was evaluated through phone session.  She is doing well on her current medication.  She is trying to keep herself busy with her involvement in the family member.  She does not leave the house unless it is important or necessary.  Her schedule appointment with the nephrology at [facility] was pushed back later this month.  She will get blood work on July 15.  Recently she saw her primary care physician who started her on amlodipine 10 mg due to high blood pressure.  Her blood pressure is much better now.  She is compliant with Wellbutrin and Xanax.  She denies any anxiety or any panic attack.  Her energy level is good.  Her great granddaughter is now 19-month-old.  Patient denies any crying spells or any feeling of hopelessness or worthlessness.  She denies drinking or using any illegal substances.  She reported no tremors, shakes or any EPS.  Her energy level is good.  Her appetite is okay.  She reported her weight is a stable.   Past Psychiatric History:Reviewed. History of depression since diagnosed with lupus in 1993. Saw psychiatrist at [facility] but not happy with the care. Seeing this Probation officer since April 2009. Tried Zoloft in the past. No history of psychiatric inpatient treatment, paranoia or any suicidal attempt.   Psychiatric Specialty Exam: Physical Exam  ROS  There were no vitals taken for this visit.There is no height or weight on file  to calculate BMI.  General Appearance: NA  Eye Contact:  NA  Speech:  Clear and Coherent and Normal Rate  Volume:  Normal  Mood:  Euthymic  Affect:  NA  Thought Process:  Goal Directed  Orientation:  Full (Time, Place, and Person)  Thought Content:  Logical  Suicidal Thoughts:  No  Homicidal Thoughts:  No  Memory:  Immediate;   Good Recent;   Good Remote;   Good  Judgement:  Good  Insight:  Good  Psychomotor Activity:  NA  Concentration:  Concentration: Good and Attention Span: Good  Recall:  Good  Fund of Knowledge:  Good  Language:  Good  Akathisia:  No  Handed:  Right  AIMS (if indicated):     Assets:  Communication Skills Desire for Improvement Housing Resilience Social Support  ADL's:  Intact  Cognition:  WNL  Sleep:   ok      Assessment and Plan: Major depressive disorder, recurrent.  Generalized anxiety disorder.  Patient is a stable on her current medication.  She had a upcoming appointment to see nephrology on July 15 at [facility] for blood work and checkup.  I will continue Wellbutrin XL 150 mg daily and Xanax 1 mg twice a day.  Discussed medication side effects and benefits specially benzodiazepine dependence tolerance and withdrawal.  Recommended to call us back if she has any question or any concern.  Follow-up in 3 months.  Follow Up Instructions:    I discussed the assessment and treatment plan with the patient.  The patient was provided an opportunity to ask questions and all were answered. The patient agreed with the plan and demonstrated an understanding of the instructions.   The patient was advised to call back or seek an in-person evaluation if the symptoms worsen or if the condition fails to improve as anticipated.  I provided 20 minutes of non-face-to-face time during this encounter.   Kathlee Nations, MD

## 2019-03-15 DIAGNOSIS — N183 Chronic kidney disease, stage 3 (moderate): Secondary | ICD-10-CM | POA: Diagnosis not present

## 2019-03-15 DIAGNOSIS — M3219 Other organ or system involvement in systemic lupus erythematosus: Secondary | ICD-10-CM | POA: Diagnosis not present

## 2019-03-15 DIAGNOSIS — I10 Essential (primary) hypertension: Secondary | ICD-10-CM | POA: Diagnosis not present

## 2019-03-15 DIAGNOSIS — N062 Isolated proteinuria with diffuse membranous glomerulonephritis: Secondary | ICD-10-CM | POA: Diagnosis not present

## 2019-04-13 DIAGNOSIS — M329 Systemic lupus erythematosus, unspecified: Secondary | ICD-10-CM | POA: Diagnosis not present

## 2019-04-13 DIAGNOSIS — L710 Perioral dermatitis: Secondary | ICD-10-CM | POA: Diagnosis not present

## 2019-04-13 DIAGNOSIS — L819 Disorder of pigmentation, unspecified: Secondary | ICD-10-CM | POA: Diagnosis not present

## 2019-06-08 ENCOUNTER — Encounter (HOSPITAL_COMMUNITY): Payer: Self-pay | Admitting: Psychiatry

## 2019-06-08 ENCOUNTER — Ambulatory Visit (INDEPENDENT_AMBULATORY_CARE_PROVIDER_SITE_OTHER): Payer: Medicare Other | Admitting: Psychiatry

## 2019-06-08 ENCOUNTER — Other Ambulatory Visit: Payer: Self-pay

## 2019-06-08 DIAGNOSIS — F330 Major depressive disorder, recurrent, mild: Secondary | ICD-10-CM | POA: Diagnosis not present

## 2019-06-08 DIAGNOSIS — F411 Generalized anxiety disorder: Secondary | ICD-10-CM

## 2019-06-08 MED ORDER — BUPROPION HCL ER (XL) 150 MG PO TB24: 150.0000 mg | ORAL_TABLET | Freq: Every day | ORAL | 0 refills | Status: DC

## 2019-06-08 MED ORDER — ALPRAZOLAM 1 MG PO TABS: 1.0000 mg | ORAL_TABLET | Freq: Two times a day (BID) | ORAL | 2 refills | Status: DC

## 2019-06-08 NOTE — Progress Notes (Signed)
Virtual Visit via Telephone Note  I connected with Jackie Goodman on 06/08/19 at 11:00 AM EDT by telephone and verified that I am speaking with the correct person using two identifiers.   I discussed the limitations, risks, security and privacy concerns of performing an evaluation and management service by telephone and the availability of in person appointments. I also discussed with the patient that there may be a patient responsible charge related to this service. The patient expressed understanding and agreed to proceed.   History of Present Illness: Patient was evaluated by phone session.  She is taking her medication as prescribed.  She endorsed lately her baby brother diagnosed with cancer and he is getting treatment.  Patient also mentioned that 1 of her aunt died in 08-Apr-2023.  She admitted sadness because of the family issues but overall she is fine.  She is sleeping good.  She enjoys the company of her 89-month old Product manager.  She is sleeping better.  She denies any irritability, anger, mania, crying spells or any feeling of hopelessness or worthlessness.  She takes Xanax 1 mg twice a day and Wellbutrin in the morning.  She has no tremors shakes or any EPS.  Her energy level is good.  She admitted lately some weight loss because she has no appetite she is feeling better.  Recently she has seen her nephrology and her creatinine is 1.7.  Patient denies drinking or using any illegal substances.  She wants to continue her current medication.  Past Psychiatric History:Reviewed. History of depression since diagnosed with lupus in 1993. Saw psychiatrist at [facility] but not happy with the care. Seeing this Probation officer since April 2009. Tried Zoloft in the past. No history of psychiatric inpatient treatment, paranoia or any suicidal attempt.  Psychiatric Specialty Exam: Physical Exam  ROS  There were no vitals taken for this visit.There is no height or weight on file to calculate BMI.  General  Appearance: NA  Eye Contact:  NA  Speech:  Clear and Coherent and Normal Rate  Volume:  Normal  Mood:  Dysphoric  Affect:  NA  Thought Process:  Goal Directed  Orientation:  Full (Time, Place, and Person)  Thought Content:  WDL  Suicidal Thoughts:  No  Homicidal Thoughts:  No  Memory:  Immediate;   Good Recent;   Good Remote;   Good  Judgement:  Good  Insight:  Good  Psychomotor Activity:  NA  Concentration:  Concentration: Good and Attention Span: Good  Recall:  Good  Fund of Knowledge:  Good  Language:  Good  Akathisia:  No  Handed:  Right  AIMS (if indicated):     Assets:  Communication Skills Desire for Improvement Housing Social Support  ADL's:  Intact  Cognition:  WNL  Sleep:   ok      Assessment and Plan: Major depressive disorder, recurrent.  Generalized anxiety disorder.  I checked her creatinine which is 1.7.  It is bumped slightly from 1.6.  Discussed her current medication and side effects.  Patient like to continue Wellbutrin and Xanax.  I offered therapy but patient does not feel she needed at this time.  Continue Wellbutrin XL 150 mg in the morning and Xanax 1 mg twice a day.  Recommended to call us back if she has any question or any concern.  Discussed benzodiazepine dependence, tolerance, withdrawal.  Follow-up in 3 months.  Follow Up Instructions:    I discussed the assessment and treatment plan with the patient. The patient was  provided an opportunity to ask questions and all were answered. The patient agreed with the plan and demonstrated an understanding of the instructions.   The patient was advised to call back or seek an in-person evaluation if the symptoms worsen or if the condition fails to improve as anticipated.  I provided 20 minutes of non-face-to-face time during this encounter.   Kathlee Nations, MD

## 2019-07-13 DIAGNOSIS — Z23 Encounter for immunization: Secondary | ICD-10-CM | POA: Diagnosis not present

## 2019-07-19 DIAGNOSIS — H25813 Combined forms of age-related cataract, bilateral: Secondary | ICD-10-CM | POA: Diagnosis not present

## 2019-07-19 DIAGNOSIS — Z79899 Other long term (current) drug therapy: Secondary | ICD-10-CM | POA: Diagnosis not present

## 2019-09-07 ENCOUNTER — Other Ambulatory Visit: Payer: Self-pay

## 2019-09-07 ENCOUNTER — Encounter (HOSPITAL_COMMUNITY): Payer: Self-pay | Admitting: Psychiatry

## 2019-09-07 ENCOUNTER — Ambulatory Visit (INDEPENDENT_AMBULATORY_CARE_PROVIDER_SITE_OTHER): Payer: Medicare Other | Admitting: Psychiatry

## 2019-09-07 DIAGNOSIS — F330 Major depressive disorder, recurrent, mild: Secondary | ICD-10-CM | POA: Diagnosis not present

## 2019-09-07 DIAGNOSIS — F411 Generalized anxiety disorder: Secondary | ICD-10-CM

## 2019-09-07 MED ORDER — BUPROPION HCL ER (XL) 150 MG PO TB24: 150.0000 mg | ORAL_TABLET | Freq: Every day | ORAL | 0 refills | Status: DC

## 2019-09-07 MED ORDER — ALPRAZOLAM 1 MG PO TABS: 1.0000 mg | ORAL_TABLET | Freq: Two times a day (BID) | ORAL | 2 refills | Status: DC

## 2019-09-07 NOTE — Progress Notes (Signed)
Virtual Visit via Telephone Note  I connected with Jackie Goodman on 09/07/19 at  9:20 AM EST by telephone and verified that I am speaking with the correct person using two identifiers.   I discussed the limitations, risks, security and privacy concerns of performing an evaluation and management service by telephone and the availability of in person appointments. I also discussed with the patient that there may be a patient responsible charge related to this service. The patient expressed understanding and agreed to proceed.   History of Present Illness: Patient was evaluated by phone session.  She is doing well on her medication.  Recently one of her brother diagnosed with cancer but he is getting treatment and slowly and gradually getting better.  Patient has a good Christmas.  She is sleeping good.  She denies any crying spells, feeling of hopelessness, irritability or any suicidal thoughts.  Her energy level is okay.  She denies any changes in her weight or appetite.  She has no tremors, shakes or any EPS.  She is scheduled to have blood work on 20th of this month.  Patient denies drinking or using any illegal substances.  She is compliant with Xanax and Wellbutrin.  She does not ask early refills.   Past Psychiatric History:Reviewed. History of depression since diagnosed with lupus in 1993. Saw psychiatrist at [facility] but not happy with the care. Seeing this Probation officer since April 2009. Tried Zoloft in the past. No history of psychiatric inpatient treatment, paranoia or any suicidal attempt.   Psychiatric Specialty Exam: Physical Exam  Review of Systems  There were no vitals taken for this visit.There is no height or weight on file to calculate BMI.  General Appearance: NA  Eye Contact:  NA  Speech:  Clear and Coherent  Volume:  Normal  Mood:  Euthymic  Affect:  NA  Thought Process:  Goal Directed  Orientation:  Full (Time, Place, and Person)  Thought Content:  WDL  Suicidal Thoughts:   No  Homicidal Thoughts:  No  Memory:  Immediate;   Good Recent;   Good Remote;   Good  Judgement:  Intact  Insight:  Present  Psychomotor Activity:  NA  Concentration:  Concentration: Good and Attention Span: Good  Recall:  Good  Fund of Knowledge:  Good  Language:  Good  Akathisia:  No  Handed:  Right  AIMS (if indicated):     Assets:  Communication Skills Desire for Improvement Housing Resilience Social Support  ADL's:  Intact  Cognition:  WNL  Sleep:   ok      Assessment and Plan: Major depressive disorder, recurrent.  Generalized anxiety disorder.  Patient is a stable on her current medication.  I will continue Wellbutrin XL 150 mg daily and Xanax 1 mg twice a day.  Discussed benzodiazepine dependence tolerance and withdrawal.  She is having blood work on 20th of this month.  Discussed medication side effects and benefits.  Recommended to call us back if she is any question of any concern.  Follow-up in 3 months.  Follow Up Instructions:    I discussed the assessment and treatment plan with the patient. The patient was provided an opportunity to ask questions and all were answered. The patient agreed with the plan and demonstrated an understanding of the instructions.   The patient was advised to call back or seek an in-person evaluation if the symptoms worsen or if the condition fails to improve as anticipated.  I provided 20 minutes of non-face-to-face time  during this encounter.   Jackie Nations, MD

## 2019-11-11 ENCOUNTER — Ambulatory Visit: Payer: Self-pay

## 2019-12-06 ENCOUNTER — Other Ambulatory Visit: Payer: Self-pay

## 2019-12-06 ENCOUNTER — Ambulatory Visit (INDEPENDENT_AMBULATORY_CARE_PROVIDER_SITE_OTHER): Payer: Medicare Other | Admitting: Psychiatry

## 2019-12-06 ENCOUNTER — Encounter (HOSPITAL_COMMUNITY): Payer: Self-pay | Admitting: Psychiatry

## 2019-12-06 DIAGNOSIS — F330 Major depressive disorder, recurrent, mild: Secondary | ICD-10-CM

## 2019-12-06 DIAGNOSIS — F411 Generalized anxiety disorder: Secondary | ICD-10-CM

## 2019-12-06 MED ORDER — ALPRAZOLAM 1 MG PO TABS: 1.0000 mg | ORAL_TABLET | Freq: Two times a day (BID) | ORAL | 2 refills | Status: DC

## 2019-12-06 MED ORDER — BUPROPION HCL ER (XL) 150 MG PO TB24: 150.0000 mg | ORAL_TABLET | Freq: Every day | ORAL | 0 refills | Status: DC

## 2019-12-06 NOTE — Progress Notes (Signed)
Virtual Visit via Telephone Note  I connected with Mayraalejandra Plazola on 12/06/19 at  9:20 AM EDT by telephone and verified that I am speaking with the correct person using two identifiers.   I discussed the limitations, risks, security and privacy concerns of performing an evaluation and management service by telephone and the availability of in person appointments. I also discussed with the patient that there may be a patient responsible charge related to this service. The patient expressed understanding and agreed to proceed.   History of Present Illness: Patient was evaluated by phone session.  She is doing stable on her medication.  She is sleeping good.  She is pleased that her brother who was diagnosed with cancer is now getting radiation treatment and feeling better.  She also got second dose of Covid vaccine and so far things are good.  She denies any crying spells, panic attack, severe anxiety or any feeling of hopelessness.  Her energy level is good.  Recently she paint her grandson room.  She has no tremors shakes or any EPS.  She recently had a visit to her renal doctor.  She is going to have a blood work next month.  Past Psychiatric History:Reviewed. H/O depression since lupus in 1993. Saw psychiatrist at [facility] but not happy with the care. Seeing this Probation officer since April 2009. Tried Zoloft in the past. No h/o inpatient treatment, paranoia or any suicidal attempt.    Psychiatric Specialty Exam: Physical Exam  Review of Systems  Weight 155 lb (70.3 kg).There is no height or weight on file to calculate BMI.  General Appearance: NA  Eye Contact:  NA  Speech:  Clear and Coherent and Normal Rate  Volume:  Normal  Mood:  Euthymic  Affect:  NA  Thought Process:  Goal Directed  Orientation:  Full (Time, Place, and Person)  Thought Content:  WDL  Suicidal Thoughts:  No  Homicidal Thoughts:  No  Memory:  Immediate;   Good Recent;   Good Remote;   Good  Judgement:  Good  Insight:   Good  Psychomotor Activity:  NA  Concentration:  Concentration: Good and Attention Span: Good  Recall:  Good  Fund of Knowledge:  Good  Language:  Good  Akathisia:  No  Handed:  Right  AIMS (if indicated):     Assets:  Communication Skills Desire for Improvement Housing Resilience Social Support  ADL's:  Intact  Cognition:  WNL  Sleep:   good      Assessment and Plan: Major depressive disorder, recurrent.  Generalized anxiety disorder.  Patient is a stable on her current medication.  She has no concerns or side effects from the medication.  Continue Wellbutrin XL 150 mg in the morning and Xanax 1 mg twice a day.  Discussed medication side effects and benefits.  Recommended to call us back if she has any question or any concern.  Follow-up in 3 months.  Follow Up Instructions:    I discussed the assessment and treatment plan with the patient. The patient was provided an opportunity to ask questions and all were answered. The patient agreed with the plan and demonstrated an understanding of the instructions.   The patient was advised to call back or seek an in-person evaluation if the symptoms worsen or if the condition fails to improve as anticipated.  I provided 20 minutes of non-face-to-face time during this encounter.   Kathlee Nations, MD

## 2020-03-06 ENCOUNTER — Other Ambulatory Visit: Payer: Self-pay

## 2020-03-06 ENCOUNTER — Encounter (HOSPITAL_COMMUNITY): Payer: Self-pay | Admitting: Psychiatry

## 2020-03-06 ENCOUNTER — Telehealth (INDEPENDENT_AMBULATORY_CARE_PROVIDER_SITE_OTHER): Payer: Medicare Other | Admitting: Psychiatry

## 2020-03-06 VITALS — Wt 156.0 lb

## 2020-03-06 DIAGNOSIS — F330 Major depressive disorder, recurrent, mild: Secondary | ICD-10-CM

## 2020-03-06 DIAGNOSIS — F419 Anxiety disorder, unspecified: Secondary | ICD-10-CM

## 2020-03-06 MED ORDER — ALPRAZOLAM 1 MG PO TABS: 1.0000 mg | ORAL_TABLET | Freq: Two times a day (BID) | ORAL | 2 refills | Status: DC

## 2020-03-06 MED ORDER — BUPROPION HCL ER (XL) 150 MG PO TB24: 150.0000 mg | ORAL_TABLET | Freq: Every day | ORAL | 0 refills | Status: DC

## 2020-03-06 NOTE — Progress Notes (Signed)
Virtual Visit via Telephone Note  I connected with Jackie Goodman on 03/06/20 at  9:00 AM EDT by telephone and verified that I am speaking with the correct person using two identifiers.  Location: Patient: home Provider: home office   I discussed the limitations, risks, security and privacy concerns of performing an evaluation and management service by telephone and the availability of in person appointments. I also discussed with the patient that there may be a patient responsible charge related to this service. The patient expressed understanding and agreed to proceed.   History of Present Illness: Patient is evaluated by phone session.  She is stable on her medication.  She had a good July 4 weekend.  Her grand child has a birthday and she enjoyed a family reunion.  She is concerned about the brother who diagnosed with cancer is now getting chemotherapy.  She was cancer free after radiation but now her cancer came back.  She is taking Wellbutrin and Xanax and denies any panic attack, crying spells or any feeling of hopelessness or worthlessness.  She feels her depression and anxiety is under control.  She sleeps good.  She has no tremors shakes or any EPS.  She denies any drug use or drinking.  She is scheduled to see her physician at [facility] next month and have blood work.  There has been no change in her medication.  Her appetite is okay.  Her weight is stable.  Past Psychiatric History:Reviewed. H/O depression since lupus in 1993. Saw psychiatrist at [facility] but not happy with the care. Seeing this Probation officer since April 2009. Tried Zoloft in the past. No h/o inpatient treatment, paranoia or any suicidal attempt.      Psychiatric Specialty Exam: Physical Exam  Review of Systems  Weight 156 lb (70.8 kg).There is no height or weight on file to calculate BMI.  General Appearance: NA  Eye Contact:  NA  Speech:  Clear and Coherent and Normal Rate  Volume:  Normal  Mood:  Euthymic  Affect:  NA   Thought Process:  Goal Directed  Orientation:  Full (Time, Place, and Person)  Thought Content:  WDL and Logical  Suicidal Thoughts:  No  Homicidal Thoughts:  No  Memory:  Immediate;   Good Recent;   Good Remote;   Good  Judgement:  Intact  Insight:  Present  Psychomotor Activity:  NA  Concentration:  Concentration: Good and Attention Span: Good  Recall:  Good  Fund of Knowledge:  Good  Language:  Good  Akathisia:  No  Handed:  Right  AIMS (if indicated):     Assets:  Communication Skills Desire for Improvement Housing Resilience Social Support Transportation  ADL's:  Intact  Cognition:  WNL  Sleep:   good      Assessment and Plan: Major depressive disorder, recurrent.  Anxiety.  Patient is a stable on her current meds.  She has no tremors shakes.  Continue Wellbutrin XL 150 mg in the morning and Xanax 1 mg twice a day.  Discussed benzodiazepine dependence tolerance and withdrawal.  She is not interested in therapy.  Recommended to call us back if she has any question, concern or if she feels worsening of the symptom.  Follow-up in 3 months.   Follow Up Instructions:    I discussed the assessment and treatment plan with the patient. The patient was provided an opportunity to ask questions and all were answered. The patient agreed with the plan and demonstrated an understanding of the instructions.  The patient was advised to call back or seek an in-person evaluation if the symptoms worsen or if the condition fails to improve as anticipated.  I provided 15 minutes of non-face-to-face time during this encounter.   Kathlee Nations, MD

## 2020-06-06 ENCOUNTER — Other Ambulatory Visit: Payer: Self-pay

## 2020-06-06 ENCOUNTER — Encounter (HOSPITAL_COMMUNITY): Payer: Self-pay | Admitting: Psychiatry

## 2020-06-06 ENCOUNTER — Telehealth (INDEPENDENT_AMBULATORY_CARE_PROVIDER_SITE_OTHER): Payer: Medicare Other | Admitting: Psychiatry

## 2020-06-06 DIAGNOSIS — F330 Major depressive disorder, recurrent, mild: Secondary | ICD-10-CM

## 2020-06-06 DIAGNOSIS — F419 Anxiety disorder, unspecified: Secondary | ICD-10-CM

## 2020-06-06 MED ORDER — BUPROPION HCL ER (XL) 150 MG PO TB24: 150.0000 mg | ORAL_TABLET | Freq: Every day | ORAL | 0 refills | Status: DC

## 2020-06-06 MED ORDER — ALPRAZOLAM 1 MG PO TABS: 1.0000 mg | ORAL_TABLET | Freq: Two times a day (BID) | ORAL | 2 refills | Status: DC

## 2020-06-06 NOTE — Progress Notes (Signed)
Virtual Visit via Telephone Note  I connected with Jackie Goodman on 06/06/20 at  9:00 AM EDT by telephone and verified that I am speaking with the correct person using two identifiers.  Location: Patient: home Provider: home office   I discussed the limitations, risks, security and privacy concerns of performing an evaluation and management service by telephone and the availability of in person appointments. I also discussed with the patient that there may be a patient responsible charge related to this service. The patient expressed understanding and agreed to proceed.   History of Present Illness: Patient is evaluated by phone session.  She endorsed increased anxiety because her brother is not doing well.  Her brother diagnosed with cancer and he was trying oral chemotherapy but that did not work and now he has to go back for traditional chemotherapy and his mother is in the ER few times because of pain and complications.  Her brother lives in [place] and she tried to visit him as much she can.  Patient reported a good relationship with her grandchild.  She recently had visit to her physician at [facility].  Her creatinine is a stable at 1.5.  She is compliant with Wellbutrin and Xanax.  She is sleeping good.  She denies any major panic attack.  She has no tremors or EPS.  Her appetite is okay and her weight is unchanged from the past.  She is no longer taking antihypertensive medication as her physician still wants to watch and monitor blood pressure without medication.  She is taking Flexeril but no more tramadol.  Patient like to continue Xanax and Wellbutrin which is helping her anxiety and depression.  Past Psychiatric History: H/Odepression since lupus in 1993. Saw psychiatrist at [facility] but not happy with the care. Seeing this Probation officer since April 2009. Tried Zoloft in the past. No h/oinpatient treatment, paranoia or any suicidal attempt.   Psychiatric Specialty Exam: Physical Exam   Review of Systems  Weight 150 lb (68 kg).There is no height or weight on file to calculate BMI.  General Appearance: NA  Eye Contact:  NA  Speech:  Normal Rate  Volume:  Normal  Mood:  Anxious  Affect:  NA  Thought Process:  Goal Directed  Orientation:  Full (Time, Place, and Person)  Thought Content:  Logical  Suicidal Thoughts:  No  Homicidal Thoughts:  No  Memory:  Immediate;   Good Recent;   Good Remote;   Good  Judgement:  Good  Insight:  Present  Psychomotor Activity:  NA  Concentration:  Concentration: Good and Attention Span: Good  Recall:  Good  Fund of Knowledge:  Good  Language:  Good  Akathisia:  No  Handed:  Right  AIMS (if indicated):     Assets:  Communication Skills Desire for Improvement Housing Resilience Social Support Transportation  ADL's:  Intact  Cognition:  WNL  Sleep:   ok      Assessment and Plan: Major depressive disorder, recurrent.  Anxiety.  I review blood work results.  Her creatinine is 1.5 in July but is stable.  Overall she feels the medicine is working.  Continue Wellbutrin XL 150 mg in the morning and Xanax 1 mg twice a day.  Patient is not interested in therapy.  Recommended to call us back if she has any question, concern or if she feels worsening of the symptom.  Follow-up in 3 months.  Follow Up Instructions:    I discussed the assessment and treatment plan with  the patient. The patient was provided an opportunity to ask questions and all were answered. The patient agreed with the plan and demonstrated an understanding of the instructions.   The patient was advised to call back or seek an in-person evaluation if the symptoms worsen or if the condition fails to improve as anticipated.  I provided 21 minutes of non-face-to-face time during this encounter.   Kathlee Nations, MD

## 2020-09-04 ENCOUNTER — Other Ambulatory Visit: Payer: Self-pay

## 2020-09-04 ENCOUNTER — Telehealth (INDEPENDENT_AMBULATORY_CARE_PROVIDER_SITE_OTHER): Payer: Medicare Other | Admitting: Psychiatry

## 2020-09-04 ENCOUNTER — Encounter (HOSPITAL_COMMUNITY): Payer: Self-pay | Admitting: Psychiatry

## 2020-09-04 DIAGNOSIS — F330 Major depressive disorder, recurrent, mild: Secondary | ICD-10-CM | POA: Diagnosis not present

## 2020-09-04 DIAGNOSIS — F419 Anxiety disorder, unspecified: Secondary | ICD-10-CM

## 2020-09-04 MED ORDER — ALPRAZOLAM 1 MG PO TABS: 1.0000 mg | ORAL_TABLET | Freq: Two times a day (BID) | ORAL | 2 refills | Status: DC

## 2020-09-04 MED ORDER — BUPROPION HCL ER (XL) 150 MG PO TB24: 150.0000 mg | ORAL_TABLET | Freq: Every day | ORAL | 0 refills | Status: DC

## 2020-09-04 NOTE — Progress Notes (Signed)
Virtual Visit via Telephone Note  I connected with Jackie Goodman on 09/04/20 at  9:00 AM EST by telephone and verified that I am speaking with the correct person using two identifiers.  Location: Patient: Home Provider: Home Office   I discussed the limitations, risks, security and privacy concerns of performing an evaluation and management service by telephone and the availability of in person appointments. I also discussed with the patient that there may be a patient responsible charge related to this service. The patient expressed understanding and agreed to proceed.   History of Present Illness: Patient is evaluated by phone session.  She is on the phone by herself.  She had a good Christmas that she was able to visit her daughter and other family members.  She is sleeping good.  She denies any crying spells or any feeling of hopelessness or worthlessness.  Her brother is getting chemotherapy for cancer and recently he was admitted but now discharged.  Patient does not want to change the medication since she feels her anxiety and depression is a stable.  She denies any anhedonia.  Her energy level is good.  Her appetite is okay.  She has no tremors, shakes or any EPS.  She does not ask for early refills for Xanax.  She denies drinking or using any illegal substances.  Past Psychiatric History: H/Odepression since lupus in 1993. Saw psychiatrist at [facility] but not happy with the care. Seeing this Clinical research associate since April 2009. Tried Zoloft in the past. No h/oinpatient treatment, paranoia or any suicidal attempt.  Psychiatric Specialty Exam: Physical Exam  Review of Systems  Weight 147 lb (66.7 kg).Body mass index is 25.23 kg/m.  General Appearance: NA  Eye Contact:  NA  Speech:  Normal Rate  Volume:  Normal  Mood:  Euthymic  Affect:  NA  Thought Process:  Goal Directed  Orientation:  Full (Time, Place, and Person)  Thought Content:  WDL and Logical  Suicidal Thoughts:  No  Homicidal  Thoughts:  No  Memory:  Immediate;   Good Recent;   Good Remote;   Good  Judgement:  Intact  Insight:  Present  Psychomotor Activity:  NA  Concentration:  Concentration: Good and Attention Span: Good  Recall:  Good  Fund of Knowledge:  Good  Language:  Good  Akathisia:  No  Handed:  Right  AIMS (if indicated):     Assets:  Communication Skills Desire for Improvement Housing Resilience Social Support Transportation  ADL's:  Intact  Cognition:  WNL  Sleep:   ok       Assessment and Plan: Major depressive disorder, recurrent.  Anxiety.  Patient is a stable on her current medication.  Discuss family issues.  Patient is handling her stress level much better than she was anticipated.  Continue Wellbutrin XL 150 mg daily and Xanax 1 mg twice a day.  Discussed medication side effects and benefits.  Recommended to call us back if she has any questions or any concern.  Follow-up in 3 months.  Follow Up Instructions:    I discussed the assessment and treatment plan with the patient. The patient was provided an opportunity to ask questions and all were answered. The patient agreed with the plan and demonstrated an understanding of the instructions.   The patient was advised to call back or seek an in-person evaluation if the symptoms worsen or if the condition fails to improve as anticipated.  I provided 13 minutes of non-face-to-face time during this encounter.  Kathlee Nations, MD

## 2020-12-04 ENCOUNTER — Telehealth (INDEPENDENT_AMBULATORY_CARE_PROVIDER_SITE_OTHER): Payer: Medicare Other | Admitting: Psychiatry

## 2020-12-04 ENCOUNTER — Other Ambulatory Visit: Payer: Self-pay

## 2020-12-04 ENCOUNTER — Encounter (HOSPITAL_COMMUNITY): Payer: Self-pay | Admitting: Psychiatry

## 2020-12-04 VITALS — Wt 148.0 lb

## 2020-12-04 DIAGNOSIS — F419 Anxiety disorder, unspecified: Secondary | ICD-10-CM

## 2020-12-04 DIAGNOSIS — F4321 Adjustment disorder with depressed mood: Secondary | ICD-10-CM | POA: Diagnosis not present

## 2020-12-04 DIAGNOSIS — F330 Major depressive disorder, recurrent, mild: Secondary | ICD-10-CM

## 2020-12-04 MED ORDER — ALPRAZOLAM 1 MG PO TABS: 1.0000 mg | ORAL_TABLET | Freq: Two times a day (BID) | ORAL | 2 refills | Status: DC

## 2020-12-04 MED ORDER — BUPROPION HCL ER (XL) 150 MG PO TB24: 150.0000 mg | ORAL_TABLET | Freq: Every day | ORAL | 0 refills | Status: DC

## 2020-12-04 NOTE — Progress Notes (Signed)
Virtual Visit via Telephone Note  I connected with Jackie Goodman on 12/04/20 at  8:40 AM EDT by telephone and verified that I am speaking with the correct person using two identifiers.  Location: Patient: home Provider: home office   I discussed the limitations, risks, security and privacy concerns of performing an evaluation and management service by telephone and the availability of in person appointments. I also discussed with the patient that there may be a patient responsible charge related to this service. The patient expressed understanding and agreed to proceed.   History of Present Illness: Patient is evaluated by phone session.  Patient is sad as 2 weeks ago his baby brother who had cancer and died.  Patient was very close to his brother.  She admitted having sometimes crying spells and thinking about him.  She has not started grief counseling but thinking about it.  Patient reported there are times when she had racing thoughts and sleep is not as good.  However she denies any anhedonia, hopelessness or any suicidal thoughts.  Patient had a good support from her family.  She also keep the grandchildren on the weekends.  She enjoys the company of the grandkids.  She had a blood work in January at [place] [facility].  Her creatinine is now 1.7.  She has a stage III renal disease.  Patient has no tremors shakes or any EPS.  She denies any panic attack, hallucination or any paranoia.  She has no tremors.  Recently she had a boil in her arm and she is scheduled to see her physician for checkup.  Past Psychiatric History: H/Odepression since lupus in 1993. Saw psychiatrist at [facility] but not happy with the care. Seeing this Probation officer since April 2009. Tried Zoloft in the past. No h/oinpatient treatment, paranoia or any suicidal attempt.  Psychiatric Specialty Exam: Physical Exam  Review of Systems  Weight 148 lb (67.1 kg).Body mass index is 25.4 kg/m.  General Appearance: NA  Eye Contact:  NA  Speech:   Slow  Volume:  Decreased  Mood:  Dysphoric  Affect:  NA  Thought Process:  Goal Directed  Orientation:  Full (Time, Place, and Person)  Thought Content:  Rumination  Suicidal Thoughts:  No  Homicidal Thoughts:  No  Memory:  Immediate;   Good Recent;   Good Remote;   Good  Judgement:  Intact  Insight:  Present  Psychomotor Activity:  NA  Concentration:  Concentration: Good and Attention Span: Good  Recall:  Good  Fund of Knowledge:  Good  Language:  Good  Akathisia:  No  Handed:  Right  AIMS (if indicated):     Assets:  Communication Skills Desire for Improvement Housing Resilience Social Support Transportation  ADL's:  Intact  Cognition:  WNL  Sleep:   fair     Assessment and Plan: Major depressive disorder, recurrent.  Anxiety.  Grief.  Discussed recent loss of her brother who died due to cancer complication.  I encouraged she should consider grief counseling through the hospice.  We will provide the number.  Patient does not want to change the medication.  I also discussed creatinine which is now slightly increased to 1.7.  Encourage hydration and follow the nephrologist recommendation.  Continue Wellbutrin XL 150 mg daily and Xanax 1 mg twice a day.  Recommended to call us back if there is any question or any concern.  Follow-up in 3 months.  Follow Up Instructions:    I discussed the assessment and treatment plan with  the patient. The patient was provided an opportunity to ask questions and all were answered. The patient agreed with the plan and demonstrated an understanding of the instructions.   The patient was advised to call back or seek an in-person evaluation if the symptoms worsen or if the condition fails to improve as anticipated.  I provided 19 minutes of non-face-to-face time during this encounter.   Kathlee Nations, MD

## 2021-02-22 ENCOUNTER — Other Ambulatory Visit: Payer: Self-pay

## 2021-02-22 ENCOUNTER — Emergency Department (HOSPITAL_COMMUNITY)
Admission: EM | Admit: 2021-02-22 | Discharge: 2021-02-22 | Disposition: A | Discharge status: Home / Self Care | Payer: Medicare Other | Attending: Emergency Medicine | Admitting: Emergency Medicine

## 2021-02-22 DIAGNOSIS — I10 Essential (primary) hypertension: Secondary | ICD-10-CM | POA: Insufficient documentation

## 2021-02-22 DIAGNOSIS — T1501XA Foreign body in cornea, right eye, initial encounter: Secondary | ICD-10-CM | POA: Diagnosis not present

## 2021-02-22 DIAGNOSIS — S0501XA Injury of conjunctiva and corneal abrasion without foreign body, right eye, initial encounter: Secondary | ICD-10-CM

## 2021-02-22 DIAGNOSIS — F17210 Nicotine dependence, cigarettes, uncomplicated: Secondary | ICD-10-CM | POA: Insufficient documentation

## 2021-02-22 DIAGNOSIS — X58XXXA Exposure to other specified factors, initial encounter: Secondary | ICD-10-CM | POA: Insufficient documentation

## 2021-02-22 MED ORDER — OFLOXACIN 0.3 % OP SOLN
1.0000 [drp] | Freq: Four times a day (QID) | OPHTHALMIC | Status: DC
  Administered 2021-02-22: 1 [drp] via OPHTHALMIC
  Filled 2021-02-22: qty 5

## 2021-02-22 MED ORDER — FLUORESCEIN SODIUM 1 MG OP STRP
1.0000 | ORAL_STRIP | Freq: Once | OPHTHALMIC | Status: AC
  Administered 2021-02-22: 1 via OPHTHALMIC
  Filled 2021-02-22: qty 1

## 2021-02-22 MED ORDER — TETRACAINE HCL 0.5 % OP SOLN
2.0000 [drp] | Freq: Once | OPHTHALMIC | Status: AC
  Administered 2021-02-22: 2 [drp] via OPHTHALMIC
  Filled 2021-02-22: qty 4

## 2021-02-22 NOTE — ED Provider Notes (Signed)
[facility] DEPT Provider Note   CSN: [id number] Arrival date & time: 02/22/21  1007     History Chief Complaint  Patient presents with   Eye Problem    Danila Eddie is a 65 y.o. female.  65 year old female who presents with acute onset of right eye discomfort and foreign body sensation this morning when she woke up.  Denies any history of injury.  No severe headaches.  No vision loss.  Has had some increased watering.  Denies any history of glaucoma.  No treatment use prior to arrival.  Endorses some photophobia.  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     Family History  Problem Relation Age of Onset   Depression Brother     Social History   Tobacco Use   Smoking status: Every Day    Packs/day: 0.25    Years: 10.00    Pack years: 2.50    Types: Cigarettes   Smokeless tobacco: Never  Vaping Use   Vaping Use: Never used  Substance Use Topics   Alcohol use: No    Alcohol/week: 0.0 standard drinks   Drug use: No    Home Medications Prior to Admission medications   Medication Sig Start Date End Date Taking? Authorizing Provider  ALPRAZolam Duanne Moron) 1 MG tablet Take 1 tablet (1 mg total) by mouth 2 (two) times  daily. 12/04/20   Arfeen, Arlyce Harman, MD  buPROPion (WELLBUTRIN XL) 150 MG 24 hr tablet Take 1 tablet (150 mg total) by mouth daily. 12/04/20   Arfeen, Arlyce Harman, MD  clobetasol (TEMOVATE) 0.05 % external solution Apply 1 application topically 2 (two) times daily.  05/09/12   [provider]  fluticasone (FLONASE) 50 MCG/ACT nasal spray Place 1 spray into the nose daily.  06/21/12   [provider]  hydroxychloroquine (PLAQUENIL) 200 MG tablet Take 200 mg by mouth 2 (two) times daily.     [provider]  omeprazole (PRILOSEC) 20 MG capsule Take 20 mg by mouth 2 (two) times daily before a meal.    [provider]    Allergies    Amoxicillin, Sulfa antibiotics, Tetracyclines & related, and Vicodin [hydrocodone-acetaminophen]  Review of Systems   Review of Systems  All other systems reviewed and are negative.  Physical Exam Updated Vital Signs BP (!) 154/83 (BP Location: Right Arm)   Pulse (!) 58   Temp 98.1 F (36.7 C) (Oral)   Resp 13   Ht 1.651 m (5\' 5" )   Wt 63.5 kg   SpO2 100%   BMI 23.30 kg/m   Physical Exam Vitals and nursing note reviewed.  Constitutional:  General: She is not in acute distress.    Appearance: Normal appearance. She is well-developed. She is not toxic-appearing.  HENT:     Head: Normocephalic and atraumatic.  Eyes:     General: Lids are normal.        Right eye: No foreign body.     Intraocular pressure: Right eye pressure is 11 mmHg. Measurements were taken using a handheld tonometer.    Conjunctiva/sclera:     Right eye: Right conjunctiva is injected. No exudate.    Pupils: Pupils are equal, round, and reactive to light.     Comments: Fluorescein dye uptake noted at about the 3 o'clock position  Neck:     Thyroid: No thyroid mass.     Trachea: No tracheal deviation.  Cardiovascular:     Rate and Rhythm: Normal rate and regular rhythm.     Heart sounds: Normal heart sounds. No murmur heard.   No gallop.  Pulmonary:      Effort: Pulmonary effort is normal. No respiratory distress.     Breath sounds: Normal breath sounds. No stridor. No decreased breath sounds, wheezing, rhonchi or rales.  Abdominal:     General: There is no distension.     Palpations: Abdomen is soft.     Tenderness: There is no abdominal tenderness. There is no rebound.  Musculoskeletal:        General: No tenderness. Normal range of motion.     Cervical back: Normal range of motion and neck supple.  Skin:    General: Skin is warm and dry.     Findings: No abrasion or rash.  Neurological:     Mental Status: She is alert and oriented to person, place, and time. Mental status is at baseline.     GCS: GCS eye subscore is 4. GCS verbal subscore is 5. GCS motor subscore is 6.     Cranial Nerves: Cranial nerves are intact. No cranial nerve deficit.     Sensory: No sensory deficit.     Motor: Motor function is intact.  Psychiatric:        Attention and Perception: Attention normal.        Speech: Speech normal.        Behavior: Behavior normal.    ED Results / Procedures / Treatments   Labs (all labs ordered are listed, but only abnormal results are displayed) Labs Reviewed - No data to display  EKG None  Radiology No results found.  Procedures Procedures   Medications Ordered in ED Medications  tetracaine (PONTOCAINE) 0.5 % ophthalmic solution 2 drop (has no administration in time range)  fluorescein ophthalmic strip 1 strip (has no administration in time range)    ED Course  I have reviewed the triage vital signs and the nursing notes.  Pertinent labs & imaging results that were available during my care of the patient were reviewed by me and considered in my medical decision making (see chart for details).    MDM Rules/Calculators/A&P                          Patient to be placed on antibiotic drops for likely corneal abrasion.  No concern for glaucoma.  Will discharge home Final Clinical Impression(s) / ED  Diagnoses Final diagnoses:  None    Rx / DC Orders ED Discharge Orders     None        Lacretia Leigh, MD 02/22/21 1120

## 2021-02-22 NOTE — Discharge Instructions (Addendum)
Use antibiotics for the next 5 to 7 days.  Follow-up with your eye doctor next week if not better

## 2021-02-22 NOTE — ED Triage Notes (Signed)
Patient woke up this morning and right eye was red, swollen, irritated . Pain reported to be 8/10. Denies injury

## 2021-03-05 ENCOUNTER — Telehealth (INDEPENDENT_AMBULATORY_CARE_PROVIDER_SITE_OTHER): Payer: Medicare Other | Admitting: Psychiatry

## 2021-03-05 ENCOUNTER — Other Ambulatory Visit: Payer: Self-pay

## 2021-03-05 ENCOUNTER — Encounter (HOSPITAL_COMMUNITY): Payer: Self-pay | Admitting: Psychiatry

## 2021-03-05 DIAGNOSIS — F419 Anxiety disorder, unspecified: Secondary | ICD-10-CM

## 2021-03-05 DIAGNOSIS — F330 Major depressive disorder, recurrent, mild: Secondary | ICD-10-CM | POA: Diagnosis not present

## 2021-03-05 MED ORDER — ALPRAZOLAM 1 MG PO TABS: 1.0000 mg | ORAL_TABLET | Freq: Two times a day (BID) | ORAL | 2 refills | Status: DC

## 2021-03-05 MED ORDER — BUPROPION HCL ER (XL) 150 MG PO TB24: 150.0000 mg | ORAL_TABLET | Freq: Every day | ORAL | 0 refills | Status: DC

## 2021-03-05 NOTE — Progress Notes (Signed)
Virtual Visit via Telephone Note  I connected with Jackie Goodman on 03/05/21 at  8:40 AM EDT by telephone and verified that I am speaking with the correct person using two identifiers.  Location: Patient: home Provider: home office   I discussed the limitations, risks, security and privacy concerns of performing an evaluation and management service by telephone and the availability of in person appointments. I also discussed with the patient that there may be a patient responsible charge related to this service. The patient expressed understanding and agreed to proceed.   History of Present Illness: Patient is evaluated by phone session.  She is taking her medication and reported her anxiety and depression is a stable.  She is still sad about losing his baby brother 3 months ago but she had a good support from her siblings.  Recently she had a visit to the emergency room because of tear in the cornea and now she feeling better after taking eyedrops.  She has upcoming appointment to see his nephrologist at [facility] on the 17th.  Patient does not want to change the medication since she feels symptoms are manageable.  Recently she had 66 year old grandson birthday and she really enjoyed the company.  Patient denies any tremors, shakes, paranoia, hallucination or any crying spells.  Her energy level is okay.  Her appetite is okay and her weight is unchanged from the past.   Past Psychiatric History: H/O depression since lupus in 1993.  Saw psychiatrist at [facility] but not happy with the care.  Seeing this Probation officer since April 2009.  Tried Zoloft in the past.  No h/o inpatient treatment, paranoia or any suicidal attempt.  Psychiatric Specialty Exam: Physical Exam  Review of Systems  Weight 140 lb (63.5 kg).There is no height or weight on file to calculate BMI.  General Appearance: NA  Eye Contact:  NA  Speech:  Slow  Volume:  Normal  Mood:  Euthymic  Affect:  NA  Thought Process:  Goal Directed   Orientation:  Full (Time, Place, and Person)  Thought Content:  WDL  Suicidal Thoughts:  No  Homicidal Thoughts:  No  Memory:  Immediate;   Good Recent;   Good Remote;   Good  Judgement:  Intact  Insight:  Present  Psychomotor Activity:  NA  Concentration:  Concentration: Good and Attention Span: Good  Recall:  Good  Fund of Knowledge:  Good  Language:  Good  Akathisia:  No  Handed:  Right  AIMS (if indicated):     Assets:  Communication Skills Desire for Improvement Housing Resilience Social Support Transportation  ADL's:  Intact  Cognition:  WNL  Sleep:   ok    Assessment and Plan: Major depressive disorder, recurrent.  Anxiety.  Patient is stable on her current medication.  She does not want to change the medication.  Continue Wellbutrin XL 150 mg daily and Xanax 1 mg twice a day.  Patient had a good supporting family network if she is coping better with the grief process.  Recommended to call us back if she has any question or any concern.  She has upcoming appointment to see the nephrologist on 17 th at [facility].  Follow-up in 3 months.  Follow Up Instructions:    I discussed the assessment and treatment plan with the patient. The patient was provided an opportunity to ask questions and all were answered. The patient agreed with the plan and demonstrated an understanding of the instructions.   The patient was advised to  call back or seek an in-person evaluation if the symptoms worsen or if the condition fails to improve as anticipated.  I provided 17 minutes of non-face-to-face time during this encounter.   Kathlee Nations, MD

## 2021-06-05 ENCOUNTER — Telehealth (HOSPITAL_BASED_OUTPATIENT_CLINIC_OR_DEPARTMENT_OTHER): Payer: Medicare Other | Admitting: Psychiatry

## 2021-06-05 ENCOUNTER — Encounter (HOSPITAL_COMMUNITY): Payer: Self-pay | Admitting: Psychiatry

## 2021-06-05 ENCOUNTER — Other Ambulatory Visit: Payer: Self-pay

## 2021-06-05 DIAGNOSIS — F330 Major depressive disorder, recurrent, mild: Secondary | ICD-10-CM

## 2021-06-05 DIAGNOSIS — F419 Anxiety disorder, unspecified: Secondary | ICD-10-CM | POA: Diagnosis not present

## 2021-06-05 MED ORDER — ALPRAZOLAM 1 MG PO TABS: 1.0000 mg | ORAL_TABLET | Freq: Two times a day (BID) | ORAL | 2 refills | Status: DC

## 2021-06-05 MED ORDER — BUPROPION HCL ER (XL) 150 MG PO TB24: 150.0000 mg | ORAL_TABLET | Freq: Every day | ORAL | 0 refills | Status: DC

## 2021-06-05 NOTE — Progress Notes (Signed)
Virtual Visit via Telephone Note  I connected with Jackie Goodman on 06/05/21 at  8:40 AM EDT by telephone and verified that I am speaking with the correct person using two identifiers.  Location: Patient: Home Provider: Home Office   I discussed the limitations, risks, security and privacy concerns of performing an evaluation and management service by telephone and the availability of in person appointments. I also discussed with the patient that there may be a patient responsible charge related to this service. The patient expressed understanding and agreed to proceed.   History of Present Illness: Patient is evaluated by phone session.  She reported had a visit to be [facility] to see the nephrologist after not feeling well.  Patient told that her creatinine was high and now she is feeling better.  Her physician recommended to see the rheumatologist but her next appointment is in 3 months.  Overall she feels things okay but occasionally she has dysphoria and she thinks about her baby brother who died earlier this year.  We have recommended grief counseling but patient reported she had a good support from siblings.  Patient is sleeping good.  She denies any suicidal thoughts or feeling of hopelessness.  Her appetite is okay and weight is stable.  She denies any paranoia or any hallucination.  Her blood pressure medicine is now changed and she is taking valsartan and her amlodipine and hydrochlorothiazide was discontinued.    Past Psychiatric History: H/O depression since lupus in 1993.  Saw psychiatrist at [facility] but not happy with the care.  Seeing this Probation officer since April 2009.  Tried Zoloft in the past.  No h/o inpatient treatment, paranoia or any suicidal attempt.   Psychiatric Specialty Exam: Physical Exam  Review of Systems  Weight 150 lb (68 kg).There is no height or weight on file to calculate BMI.  General Appearance: NA  Eye Contact:  NA  Speech:  Slow  Volume:  Normal  Mood:  Dysphoric   Affect:  NA  Thought Process:  Descriptions of Associations: Intact  Orientation:  Full (Time, Place, and Person)  Thought Content:  Rumination  Suicidal Thoughts:  No  Homicidal Thoughts:  No  Memory:  Immediate;   Good Recent;   Good Remote;   Fair  Judgement:  Intact  Insight:  Present  Psychomotor Activity:  NA  Concentration:  Concentration: Good and Attention Span: Good  Recall:  Good  Fund of Knowledge:  Good  Language:  Good  Akathisia:  No  Handed:  Right  AIMS (if indicated):     Assets:  Communication Skills Desire for Improvement Housing Social Support Transportation  ADL's:  Intact  Cognition:  WNL  Sleep:   7 hrs      Assessment and Plan: Major depressive disorder, recurrent.  Anxiety.  I reviewed blood work results from last month at [facility].  Her creatinine is 1.9 which is down from 1.4 but patient clinically feeling better.  She is now on different antihypertensive medication.  Patient trying to get an earlier appointment to see a rheumatologist.  She does not want to change the medication since she feels stable however she admitted to consider grief counseling as she is still feeling some time sad when she thinks about her baby brother.  Continue Wellbutrin XL 150 mg daily and Xanax 1 mg twice a day.  Recommended to call us back if she has any question of any concern.  Follow-up in 3 months.  Follow Up Instructions:  I discussed the assessment and treatment plan with the patient. The patient was provided an opportunity to ask questions and all were answered. The patient agreed with the plan and demonstrated an understanding of the instructions.   The patient was advised to call back or seek an in-person evaluation if the symptoms worsen or if the condition fails to improve as anticipated.  I provided 17 minutes of non-face-to-face time during this encounter.   Kathlee Nations, MD

## 2021-09-05 ENCOUNTER — Encounter (HOSPITAL_COMMUNITY): Payer: Self-pay | Admitting: Psychiatry

## 2021-09-05 ENCOUNTER — Telehealth (HOSPITAL_BASED_OUTPATIENT_CLINIC_OR_DEPARTMENT_OTHER): Payer: Medicare Other | Admitting: Psychiatry

## 2021-09-05 ENCOUNTER — Other Ambulatory Visit: Payer: Self-pay

## 2021-09-05 DIAGNOSIS — F419 Anxiety disorder, unspecified: Secondary | ICD-10-CM | POA: Diagnosis not present

## 2021-09-05 DIAGNOSIS — F330 Major depressive disorder, recurrent, mild: Secondary | ICD-10-CM

## 2021-09-05 MED ORDER — BUPROPION HCL ER (XL) 150 MG PO TB24: 150.0000 mg | ORAL_TABLET | Freq: Every day | ORAL | 0 refills | Status: DC

## 2021-09-05 MED ORDER — ALPRAZOLAM 1 MG PO TABS: 1.0000 mg | ORAL_TABLET | Freq: Two times a day (BID) | ORAL | 2 refills | Status: DC

## 2021-09-05 NOTE — Progress Notes (Signed)
Virtual Visit via Telephone Note  I connected with Jackie Goodman on 09/05/21 at  8:40 AM EST by telephone and verified that I am speaking with the correct person using two identifiers.  Location: Patient: Home Provider: Home Office   I discussed the limitations, risks, security and privacy concerns of performing an evaluation and management service by telephone and the availability of in person appointments. I also discussed with the patient that there may be a patient responsible charge related to this service. The patient expressed understanding and agreed to proceed.   History of Present Illness: Patient is evaluated by phone session.  She had a quiet Christmas.  She stays home and regret did not go to her brother's grave and did not able to see her family members in [place].  Patient told she has a flareup of lupus and she is not taking steroids.  Her creatinine was 1.8.  She is now feeling better.  She is sleeping okay.  She denies any crying spells or any feeling of hopelessness or worthlessness.  She still think about her baby brother and sometimes she thinks should get the grief counseling.  We have offered in the past but patient has not made the decision.  She denies any crying spells or any suicidal thoughts.  Her appetite is okay and her weight is unchanged from the past.  She denies any paranoia.  She had a good support from her family.  She is compliant with her medication.  She has no tremor or shakes or any EPS.    Past Psychiatric History: H/O depression since lupus in 1993.  Saw psychiatrist at [facility] but not happy with the care.  Seeing this Probation officer since April 2009.  Tried Zoloft in the past.  No h/o inpatient treatment, paranoia or any suicidal attempt.  Psychiatric Specialty Exam: Physical Exam  Review of Systems  Weight 152 lb (68.9 kg).There is no height or weight on file to calculate BMI.  General Appearance: NA  Eye Contact:  NA  Speech:  Slow  Volume:  Normal   Mood:  Dysphoric  Affect:  NA  Thought Process:  Goal Directed  Orientation:  Full (Time, Place, and Person)  Thought Content:  Logical  Suicidal Thoughts:  No  Homicidal Thoughts:  No  Memory:  Immediate;   Good Recent;   Good Remote;   Fair  Judgement:  Intact  Insight:  Present  Psychomotor Activity:  NA  Concentration:  Concentration: Good and Attention Span: Good  Recall:  Good  Fund of Knowledge:  Good  Language:  Good  Akathisia:  No  Handed:  Right  AIMS (if indicated):     Assets:  Communication Skills Desire for Improvement Housing Social Support Transportation  ADL's:  Intact  Cognition:  WNL  Sleep:   ok      Assessment and Plan: Major depressive disorder, recurrent.  Anxiety.  I reviewed blood work results.  Her creatinine is 1.8 and she had a recently lupus flareup and taking steroids.  Patient not feeling better.  She does not want to change the medication.  We talk about grief counseling again but patient promised to give [place] a call back if she feels needed therapy.  She like to keep the current medication.  Continue Wellbutrin XL 150 mg daily and Xanax 1 mg twice a day.  Recommended to call us back if she has any question or any concern.  Follow-up in 3 months.  Follow Up Instructions:  I discussed the assessment and treatment plan with the patient. The patient was provided an opportunity to ask questions and all were answered. The patient agreed with the plan and demonstrated an understanding of the instructions.   The patient was advised to call back or seek an in-person evaluation if the symptoms worsen or if the condition fails to improve as anticipated.  I provided 31 minutes of non-face-to-face time during this encounter.   Kathlee Nations, MD

## 2021-09-10 DIAGNOSIS — H25813 Combined forms of age-related cataract, bilateral: Secondary | ICD-10-CM | POA: Diagnosis not present

## 2021-09-10 DIAGNOSIS — Z79899 Other long term (current) drug therapy: Secondary | ICD-10-CM | POA: Diagnosis not present

## 2021-10-01 DIAGNOSIS — N1832 Chronic kidney disease, stage 3b: Secondary | ICD-10-CM | POA: Diagnosis not present

## 2021-10-01 DIAGNOSIS — M542 Cervicalgia: Secondary | ICD-10-CM | POA: Diagnosis not present

## 2021-10-01 DIAGNOSIS — N069 Isolated proteinuria with unspecified morphologic lesion: Secondary | ICD-10-CM | POA: Diagnosis not present

## 2021-10-01 DIAGNOSIS — I10 Essential (primary) hypertension: Secondary | ICD-10-CM | POA: Diagnosis not present

## 2021-10-10 DIAGNOSIS — K529 Noninfective gastroenteritis and colitis, unspecified: Secondary | ICD-10-CM | POA: Diagnosis not present

## 2021-10-10 DIAGNOSIS — M329 Systemic lupus erythematosus, unspecified: Secondary | ICD-10-CM | POA: Diagnosis not present

## 2021-10-13 DIAGNOSIS — Z79899 Other long term (current) drug therapy: Secondary | ICD-10-CM | POA: Diagnosis not present

## 2021-10-13 DIAGNOSIS — H43821 Vitreomacular adhesion, right eye: Secondary | ICD-10-CM | POA: Diagnosis not present

## 2021-10-13 DIAGNOSIS — E049 Nontoxic goiter, unspecified: Secondary | ICD-10-CM | POA: Diagnosis not present

## 2021-10-13 DIAGNOSIS — H2513 Age-related nuclear cataract, bilateral: Secondary | ICD-10-CM | POA: Diagnosis not present

## 2021-10-13 DIAGNOSIS — E213 Hyperparathyroidism, unspecified: Secondary | ICD-10-CM | POA: Diagnosis not present

## 2021-10-28 DIAGNOSIS — M5010 Cervical disc disorder with radiculopathy, unspecified cervical region: Secondary | ICD-10-CM | POA: Diagnosis not present

## 2021-10-28 DIAGNOSIS — M5412 Radiculopathy, cervical region: Secondary | ICD-10-CM | POA: Diagnosis not present

## 2021-10-28 DIAGNOSIS — M4312 Spondylolisthesis, cervical region: Secondary | ICD-10-CM | POA: Diagnosis not present

## 2021-10-29 DIAGNOSIS — Z1211 Encounter for screening for malignant neoplasm of colon: Secondary | ICD-10-CM | POA: Diagnosis not present

## 2021-10-29 DIAGNOSIS — K219 Gastro-esophageal reflux disease without esophagitis: Secondary | ICD-10-CM | POA: Diagnosis not present

## 2021-10-29 DIAGNOSIS — R1310 Dysphagia, unspecified: Secondary | ICD-10-CM | POA: Diagnosis not present

## 2021-10-29 DIAGNOSIS — R141 Gas pain: Secondary | ICD-10-CM | POA: Diagnosis not present

## 2021-10-29 DIAGNOSIS — D7589 Other specified diseases of blood and blood-forming organs: Secondary | ICD-10-CM | POA: Diagnosis not present

## 2021-11-11 DIAGNOSIS — N3281 Overactive bladder: Secondary | ICD-10-CM | POA: Diagnosis not present

## 2021-11-18 DIAGNOSIS — E213 Hyperparathyroidism, unspecified: Secondary | ICD-10-CM | POA: Diagnosis not present

## 2021-11-24 DIAGNOSIS — Z79899 Other long term (current) drug therapy: Secondary | ICD-10-CM | POA: Diagnosis not present

## 2021-12-05 ENCOUNTER — Encounter (HOSPITAL_COMMUNITY): Payer: Self-pay | Admitting: Psychiatry

## 2021-12-05 ENCOUNTER — Telehealth (HOSPITAL_BASED_OUTPATIENT_CLINIC_OR_DEPARTMENT_OTHER): Payer: Medicare Other | Admitting: Psychiatry

## 2021-12-05 DIAGNOSIS — F330 Major depressive disorder, recurrent, mild: Secondary | ICD-10-CM

## 2021-12-05 DIAGNOSIS — F419 Anxiety disorder, unspecified: Secondary | ICD-10-CM | POA: Diagnosis not present

## 2021-12-05 MED ORDER — ALPRAZOLAM 1 MG PO TABS: 1.0000 mg | ORAL_TABLET | Freq: Two times a day (BID) | ORAL | 2 refills | Status: DC

## 2021-12-05 MED ORDER — BUPROPION HCL ER (XL) 150 MG PO TB24: 150.0000 mg | ORAL_TABLET | Freq: Every day | ORAL | 0 refills | Status: DC

## 2021-12-05 NOTE — Progress Notes (Signed)
Virtual Visit via Telephone Note ? ?I connected with Jackie Goodman on 12/05/21 at  8:40 AM EDT by telephone and verified that I am speaking with the correct person using two identifiers. ? ?Location: ?Patient: Home ?Provider: Home Office ?  ?I discussed the limitations, risks, security and privacy concerns of performing an evaluation and management service by telephone and the availability of in person appointments. I also discussed with the patient that there may be a patient responsible charge related to this service. The patient expressed understanding and agreed to proceed. ? ? ?History of Present Illness: ?Patient is evaluated by phone session.  She has been busy seeing multiple doctors at [facility].  She was diagnosed with hyperparathyroidism and recommended to remove the thyroid cyst and she is scheduled to have procedure and coming months.  She also scheduled to see a retina specialist and recently had bone density.  She is busy but otherwise doing okay on her current medication.  She denies any crying spells, feeling of hopelessness or worthlessness.  She denies any paranoia or any hallucination.  She is taking Xanax and Wellbutrin.  Her last blood work shows creatinine 1.8 which is stable.  Patient told 2 weeks ago a distant cousin died at age of 109 due to heart attack.  She went to [place] to attend the funeral.  She is looking forward for [place] weekend.  She has plan to see the grandkids.  She has 4 grandkids and 3 great grandkids.  She enjoyed the company of them.  Patient has no tremor or shakes or any EPS.  She like to keep the current medication.   ? ?    ?Past Psychiatric History: ?H/O depression since lupus in 1993.  Saw psychiatrist at [facility] but not happy with the care.  Seeing this Probation officer since April 2009.  Tried Zoloft in the past.  No h/o inpatient treatment, paranoia or any suicidal attempt. ? ?Psychiatric Specialty Exam: ?Physical Exam  ?Review of Systems  ?Weight 143 lb (64.9 kg).There is no  height or weight on file to calculate BMI.  ?General Appearance: NA  ?Eye Contact:  NA  ?Speech:  Slow  ?Volume:  Normal  ?Mood:  Euthymic  ?Affect:  NA  ?Thought Process:  Goal Directed  ?Orientation:  Full (Time, Place, and Person)  ?Thought Content:  WDL  ?Suicidal Thoughts:  No  ?Homicidal Thoughts:  No  ?Memory:  Immediate;   Good ?Recent;   Good ?Remote;   Good  ?Judgement:  Intact  ?Insight:  Present  ?Psychomotor Activity:  NA  ?Concentration:  Concentration: Good and Attention Span: Good  ?Recall:  Good  ?Fund of Knowledge:  Good  ?Language:  Good  ?Akathisia:  No  ?Handed:  Right  ?AIMS (if indicated):     ?Assets:  Communication Skills ?Desire for Improvement ?Housing ?Resilience ?Social Support ?Transportation  ?ADL's:  Intact  ?Cognition:  WNL  ?Sleep:   ok  ? ? ? ? ?Assessment and Plan: ?Major depressive disorder, recurrent.  Anxiety. ? ?I reviewed blood work results.  Her creatinine is 1.8 which is stable from the past.  She is no longer taking steroids.  She is busy seeing multiple doctors and may have procedure to remove her thyroid cyst.  She does not want to change the medication.  We will continue Wellbutrin XL 150 mg daily and Xanax 1 mg twice a day.  Recommended to call us back if there is any question or any concern.  Follow-up in 3 months. ? ?  Follow Up Instructions: ? ?  ?I discussed the assessment and treatment plan with the patient. The patient was provided an opportunity to ask questions and all were answered. The patient agreed with the plan and demonstrated an understanding of the instructions. ?  ?The patient was advised to call back or seek an in-person evaluation if the symptoms worsen or if the condition fails to improve as anticipated. ? ?Collaboration of Care: Other provider involved in patient's care AEB notes are available in epic to review ? ?Patient/Guardian was advised Release of Information must be obtained prior to any record release in order to collaborate their care with  an outside provider. Patient/Guardian was advised if they have not already done so to contact the registration department to sign all necessary forms in order for [place] to release information regarding their care.  ? ?Consent: Patient/Guardian gives verbal consent for treatment and assignment of benefits for services provided during this visit. Patient/Guardian expressed understanding and agreed to proceed.   ? ?I provided 16 minutes of non-face-to-face time during this encounter. ? ? ?Kathlee Nations, MD  ?

## 2022-01-01 DIAGNOSIS — L710 Perioral dermatitis: Secondary | ICD-10-CM | POA: Diagnosis not present

## 2022-01-01 DIAGNOSIS — L219 Seborrheic dermatitis, unspecified: Secondary | ICD-10-CM | POA: Diagnosis not present

## 2022-01-01 DIAGNOSIS — L932 Other local lupus erythematosus: Secondary | ICD-10-CM | POA: Diagnosis not present

## 2022-01-01 DIAGNOSIS — L720 Epidermal cyst: Secondary | ICD-10-CM | POA: Diagnosis not present

## 2022-01-01 DIAGNOSIS — L309 Dermatitis, unspecified: Secondary | ICD-10-CM | POA: Diagnosis not present

## 2022-01-05 DIAGNOSIS — E213 Hyperparathyroidism, unspecified: Secondary | ICD-10-CM | POA: Diagnosis not present

## 2022-01-12 DIAGNOSIS — L723 Sebaceous cyst: Secondary | ICD-10-CM | POA: Diagnosis not present

## 2022-01-12 DIAGNOSIS — N3281 Overactive bladder: Secondary | ICD-10-CM | POA: Diagnosis not present

## 2022-01-15 DIAGNOSIS — L723 Sebaceous cyst: Secondary | ICD-10-CM | POA: Diagnosis not present

## 2022-01-23 DIAGNOSIS — Z79899 Other long term (current) drug therapy: Secondary | ICD-10-CM | POA: Diagnosis not present

## 2022-01-23 DIAGNOSIS — N183 Chronic kidney disease, stage 3 unspecified: Secondary | ICD-10-CM | POA: Diagnosis not present

## 2022-01-23 DIAGNOSIS — F17210 Nicotine dependence, cigarettes, uncomplicated: Secondary | ICD-10-CM | POA: Diagnosis not present

## 2022-01-23 DIAGNOSIS — Z79621 Long term (current) use of calcineurin inhibitor: Secondary | ICD-10-CM | POA: Diagnosis not present

## 2022-01-23 DIAGNOSIS — M3214 Glomerular disease in systemic lupus erythematosus: Secondary | ICD-10-CM | POA: Diagnosis not present

## 2022-01-23 DIAGNOSIS — E213 Hyperparathyroidism, unspecified: Secondary | ICD-10-CM | POA: Diagnosis not present

## 2022-01-23 DIAGNOSIS — D440 Neoplasm of uncertain behavior of thyroid gland: Secondary | ICD-10-CM | POA: Diagnosis not present

## 2022-01-23 DIAGNOSIS — E041 Nontoxic single thyroid nodule: Secondary | ICD-10-CM | POA: Diagnosis not present

## 2022-01-23 DIAGNOSIS — I129 Hypertensive chronic kidney disease with stage 1 through stage 4 chronic kidney disease, or unspecified chronic kidney disease: Secondary | ICD-10-CM | POA: Diagnosis not present

## 2022-01-23 DIAGNOSIS — M329 Systemic lupus erythematosus, unspecified: Secondary | ICD-10-CM | POA: Diagnosis not present

## 2022-03-06 ENCOUNTER — Telehealth (HOSPITAL_BASED_OUTPATIENT_CLINIC_OR_DEPARTMENT_OTHER): Payer: Medicare Other | Admitting: Psychiatry

## 2022-03-06 ENCOUNTER — Encounter (HOSPITAL_COMMUNITY): Payer: Self-pay | Admitting: Psychiatry

## 2022-03-06 DIAGNOSIS — F330 Major depressive disorder, recurrent, mild: Secondary | ICD-10-CM

## 2022-03-06 DIAGNOSIS — F419 Anxiety disorder, unspecified: Secondary | ICD-10-CM

## 2022-03-06 MED ORDER — BUPROPION HCL ER (XL) 150 MG PO TB24: 150.0000 mg | ORAL_TABLET | Freq: Every day | ORAL | 0 refills | Status: DC

## 2022-03-06 MED ORDER — ALPRAZOLAM 1 MG PO TABS: 1.0000 mg | ORAL_TABLET | Freq: Two times a day (BID) | ORAL | 1 refills | Status: DC

## 2022-03-06 NOTE — Progress Notes (Signed)
Virtual Visit via Telephone Note  I connected with Jackie Goodman on 03/06/22 at 11:20 AM EDT by telephone and verified that I am speaking with the correct person using two identifiers.  Location: Patient: Home Provider: Home Office   I discussed the limitations, risks, security and privacy concerns of performing an evaluation and management service by telephone and the availability of in person appointments. I also discussed with the patient that there may be a patient responsible charge related to this service. The patient expressed understanding and agreed to proceed.   History of Present Illness: Patient is evaluated by phone session.  She endorses recently increased irritability, mood swing, not sleeping good.  She has a lot of health concerns.  She has a thyroid nodule and a mass and had biopsy which was initially inconclusive.  Patient told biopsy was sent to [facility] and find out that she has no cancer but since it is a very large mass.  Need to be removed.  Patient also having flareup of lupus and having a lot of pain in her body.  She could not sleep very well.  She cannot take pain medicine other than Tylenol.  She used to take tramadol but it is not given.  Her creatinine is 2.1.  She admitted having a hard time dealing her symptoms.  Her plan is to have a procedure as soon as possible.  Her rheumatologist do not want to prescribe any medication until thyroid nodule procedure.  Patient admitted getting frustrated because she is seeing doctors very frequently.  She does not get rest as much.  She is taking Xanax and Wellbutrin.  She has no tremors or shakes.  She had a support from her sister.  She denies any crying spells or any feeling of hopelessness or worthlessness.  She denies any suicidal thoughts.   Past Psychiatric History: H/O depression since lupus in 1993.  Saw psychiatrist at [facility] but not happy with the care.  Seeing this Probation officer since April 2009.  Tried Zoloft in the past.  No  h/o inpatient treatment, paranoia or any suicidal attempt.   Psychiatric Specialty Exam: Physical Exam  Review of Systems  Musculoskeletal:  Positive for back pain, joint swelling and neck pain.    Weight 145 lb (65.8 kg).Body mass index is 24.13 kg/m.  General Appearance: NA  Eye Contact:  NA  Speech:  Slow  Volume:  Decreased  Mood:  Depressed, Dysphoric, and Irritable  Affect:  NA  Thought Process:  Goal Directed  Orientation:  Full (Time, Place, and Person)  Thought Content:  Rumination  Suicidal Thoughts:  No  Homicidal Thoughts:  No  Memory:  Immediate;   Good Recent;   Good Remote;   Good  Judgement:  Intact  Insight:  Present  Psychomotor Activity:  NA  Concentration:  Concentration: Fair and Attention Span: Good  Recall:  Good  Fund of Knowledge:  Good  Language:  Good  Akathisia:  No  Handed:  Right  AIMS (if indicated):     Assets:  Communication Skills Desire for Improvement Housing Resilience Social Support  ADL's:  Intact  Cognition:  WNL  Sleep:   fair     Assessment and Plan: Major depressive disorder, recurrent.  Anxiety.  I reviewed blood work results.  Her creatinine is now 2.1.  She has multiple health issues including flareup of lupus, thyroid nodules and chronic pain in her body.  Her rheumatologist did not want to do anything until she had a procedure  to remove thyroid nodules.  We talk about addressing pain symptoms as she believes main reason not able to sleep and getting irritated and irritable.  She can only take Tylenol and that does not help.  I recommend talk to her physician to try tramadol which she had to use in the past if safe to take.  Patient is hoping to have a procedure very soon.  We agree had a follow-up appointment in 6 weeks after the procedure.  If symptoms do not improve we will consider adjusting Wellbutrin dose or try a different medication however given the fact that her symptoms exacerbated due to pain I will wait until  pain subsided.  Patient agreed with the plan.  I recommend call us back if is any question of any time having suicidal thoughts or homicidal halogen need to call 911 or go to local emergency room.  Follow-up in 6 weeks.  Follow Up Instructions:    I discussed the assessment and treatment plan with the patient. The patient was provided an opportunity to ask questions and all were answered. The patient agreed with the plan and demonstrated an understanding of the instructions.   The patient was advised to call back or seek an in-person evaluation if the symptoms worsen or if the condition fails to improve as anticipated.  Collaboration of Care: Other provider involved in patient's care AEB notes are available in epic to review.  Patient/Guardian was advised Release of Information must be obtained prior to any record release in order to collaborate their care with an outside provider. Patient/Guardian was advised if they have not already done so to contact the registration department to sign all necessary forms in order for [place] to release information regarding their care.   Consent: Patient/Guardian gives verbal consent for treatment and assignment of benefits for services provided during this visit. Patient/Guardian expressed understanding and agreed to proceed.    I provided 23 minutes of non-face-to-face time during this encounter.   Kathlee Nations, MD

## 2022-03-10 DIAGNOSIS — D485 Neoplasm of uncertain behavior of skin: Secondary | ICD-10-CM | POA: Diagnosis not present

## 2022-03-24 DIAGNOSIS — N069 Isolated proteinuria with unspecified morphologic lesion: Secondary | ICD-10-CM | POA: Diagnosis not present

## 2022-03-24 DIAGNOSIS — N1832 Chronic kidney disease, stage 3b: Secondary | ICD-10-CM | POA: Diagnosis not present

## 2022-03-24 DIAGNOSIS — E559 Vitamin D deficiency, unspecified: Secondary | ICD-10-CM | POA: Diagnosis not present

## 2022-03-24 DIAGNOSIS — K219 Gastro-esophageal reflux disease without esophagitis: Secondary | ICD-10-CM | POA: Diagnosis not present

## 2022-03-24 DIAGNOSIS — I10 Essential (primary) hypertension: Secondary | ICD-10-CM | POA: Diagnosis not present

## 2022-03-24 DIAGNOSIS — Z1211 Encounter for screening for malignant neoplasm of colon: Secondary | ICD-10-CM | POA: Diagnosis not present

## 2022-03-24 DIAGNOSIS — F32A Depression, unspecified: Secondary | ICD-10-CM | POA: Diagnosis not present

## 2022-03-24 DIAGNOSIS — Z01818 Encounter for other preprocedural examination: Secondary | ICD-10-CM | POA: Diagnosis not present

## 2022-03-24 DIAGNOSIS — G43809 Other migraine, not intractable, without status migrainosus: Secondary | ICD-10-CM | POA: Diagnosis not present

## 2022-03-24 DIAGNOSIS — R918 Other nonspecific abnormal finding of lung field: Secondary | ICD-10-CM | POA: Diagnosis not present

## 2022-04-01 DIAGNOSIS — M3320 Polymyositis, organ involvement unspecified: Secondary | ICD-10-CM | POA: Diagnosis not present

## 2022-04-01 DIAGNOSIS — I10 Essential (primary) hypertension: Secondary | ICD-10-CM | POA: Diagnosis not present

## 2022-04-01 DIAGNOSIS — N1832 Chronic kidney disease, stage 3b: Secondary | ICD-10-CM | POA: Diagnosis not present

## 2022-04-01 DIAGNOSIS — J4521 Mild intermittent asthma with (acute) exacerbation: Secondary | ICD-10-CM | POA: Diagnosis not present

## 2022-04-01 DIAGNOSIS — M329 Systemic lupus erythematosus, unspecified: Secondary | ICD-10-CM | POA: Diagnosis not present

## 2022-04-01 DIAGNOSIS — R801 Persistent proteinuria, unspecified: Secondary | ICD-10-CM | POA: Diagnosis not present

## 2022-04-02 DIAGNOSIS — I129 Hypertensive chronic kidney disease with stage 1 through stage 4 chronic kidney disease, or unspecified chronic kidney disease: Secondary | ICD-10-CM | POA: Diagnosis not present

## 2022-04-02 DIAGNOSIS — K635 Polyp of colon: Secondary | ICD-10-CM | POA: Diagnosis not present

## 2022-04-02 DIAGNOSIS — K2940 Chronic atrophic gastritis without bleeding: Secondary | ICD-10-CM | POA: Diagnosis not present

## 2022-04-02 DIAGNOSIS — R519 Headache, unspecified: Secondary | ICD-10-CM | POA: Diagnosis not present

## 2022-04-02 DIAGNOSIS — K2289 Other specified disease of esophagus: Secondary | ICD-10-CM | POA: Diagnosis not present

## 2022-04-02 DIAGNOSIS — M329 Systemic lupus erythematosus, unspecified: Secondary | ICD-10-CM | POA: Diagnosis not present

## 2022-04-02 DIAGNOSIS — K2980 Duodenitis without bleeding: Secondary | ICD-10-CM | POA: Diagnosis not present

## 2022-04-02 DIAGNOSIS — K5730 Diverticulosis of large intestine without perforation or abscess without bleeding: Secondary | ICD-10-CM | POA: Diagnosis not present

## 2022-04-02 DIAGNOSIS — K219 Gastro-esophageal reflux disease without esophagitis: Secondary | ICD-10-CM | POA: Diagnosis not present

## 2022-04-02 DIAGNOSIS — Z1211 Encounter for screening for malignant neoplasm of colon: Secondary | ICD-10-CM | POA: Diagnosis not present

## 2022-04-02 DIAGNOSIS — N189 Chronic kidney disease, unspecified: Secondary | ICD-10-CM | POA: Diagnosis not present

## 2022-04-02 DIAGNOSIS — R1310 Dysphagia, unspecified: Secondary | ICD-10-CM | POA: Diagnosis not present

## 2022-04-02 DIAGNOSIS — Z1212 Encounter for screening for malignant neoplasm of rectum: Secondary | ICD-10-CM | POA: Diagnosis not present

## 2022-04-09 DIAGNOSIS — M329 Systemic lupus erythematosus, unspecified: Secondary | ICD-10-CM | POA: Diagnosis not present

## 2022-04-09 DIAGNOSIS — M3320 Polymyositis, organ involvement unspecified: Secondary | ICD-10-CM | POA: Diagnosis not present

## 2022-04-09 DIAGNOSIS — J4521 Mild intermittent asthma with (acute) exacerbation: Secondary | ICD-10-CM | POA: Diagnosis not present

## 2022-04-09 DIAGNOSIS — I10 Essential (primary) hypertension: Secondary | ICD-10-CM | POA: Diagnosis not present

## 2022-04-09 DIAGNOSIS — N1832 Chronic kidney disease, stage 3b: Secondary | ICD-10-CM | POA: Diagnosis not present

## 2022-04-09 DIAGNOSIS — R801 Persistent proteinuria, unspecified: Secondary | ICD-10-CM | POA: Diagnosis not present

## 2022-04-20 ENCOUNTER — Encounter (HOSPITAL_COMMUNITY): Payer: Self-pay | Admitting: Psychiatry

## 2022-04-20 ENCOUNTER — Telehealth (HOSPITAL_BASED_OUTPATIENT_CLINIC_OR_DEPARTMENT_OTHER): Payer: Medicare Other | Admitting: Psychiatry

## 2022-04-20 DIAGNOSIS — F419 Anxiety disorder, unspecified: Secondary | ICD-10-CM | POA: Diagnosis not present

## 2022-04-20 DIAGNOSIS — F330 Major depressive disorder, recurrent, mild: Secondary | ICD-10-CM

## 2022-04-20 MED ORDER — BUPROPION HCL ER (XL) 150 MG PO TB24: 150.0000 mg | ORAL_TABLET | Freq: Every day | ORAL | 0 refills | Status: DC

## 2022-04-20 MED ORDER — ALPRAZOLAM 1 MG PO TABS: 1.0000 mg | ORAL_TABLET | Freq: Two times a day (BID) | ORAL | 2 refills | Status: DC

## 2022-04-20 NOTE — Progress Notes (Signed)
Virtual Visit via Telephone Note  I connected with Jackie Goodman on 04/20/22 at 10:00 AM EDT by telephone and verified that I am speaking with the correct person using two identifiers.  Location: Patient: Home Provider: Home Office   I discussed the limitations, risks, security and privacy concerns of performing an evaluation and management service by telephone and the availability of in person appointments. I also discussed with the patient that there may be a patient responsible charge related to this service. The patient expressed understanding and agreed to proceed.   History of Present Illness: Patient is evaluated by phone session.  She is busy with the doctor's appointment.  She is pleased that labs are much better.  She is now on Kidney diet which means low or no potassium.  Her last potassium back to normal after about 5.  Her creatinine also came down to 1.6.  She is physically better.  She has some time tiredness and fatigue due to her chronic health.  Patient told she was very sad because they have to put their dog who they have for 8 years.  Patient has a good support system.  She sleeps fair but sometimes she has to wake up to go to the bathroom.  He has no tremor or shakes or any EPS.  She had colonoscopy and endoscopy and polyps were removed.  She has upcoming appointment with GI.  Patient does not want to change the medication.  She is taking Xanax 1 mg twice a day and Wellbutrin in the morning.  She denies any crying spells or any feeling of hopelessness or worthlessness.  She admitted few pounds weight loss because of the change in her diet.  Past Psychiatric History: H/O depression since lupus in 1993.  Saw psychiatrist at [facility] but not happy with the care.  Seeing this Probation officer since April 2009.  Tried Zoloft in the past.  No h/o inpatient treatment, paranoia or any suicidal attempt.   Psychiatric Specialty Exam: Physical Exam  Review of Systems  Weight 143 lb (64.9 kg).There is  no height or weight on file to calculate BMI.  General Appearance: NA  Eye Contact:  NA  Speech:  Slow  Volume:  Decreased  Mood:  Dysphoric  Affect:  NA  Thought Process:  Goal Directed  Orientation:  Full (Time, Place, and Person)  Thought Content:  Rumination  Suicidal Thoughts:  No  Homicidal Thoughts:  No  Memory:  Immediate;   Good Recent;   Good Remote;   Good  Judgement:  Intact  Insight:  Present  Psychomotor Activity:  NA  Concentration:  Concentration: Good and Attention Span: Good  Recall:  Good  Fund of Knowledge:  Good  Language:  Good  Akathisia:  No  Handed:  Right  AIMS (if indicated):     Assets:  Communication Skills Desire for Improvement Housing Resilience Transportation  ADL's:  Intact  Cognition:  WNL  Sleep:   fair      Assessment and Plan: Major depressive disorder, recurrent.  Anxiety.  I reviewed blood work results.  Her creatinine is improved and potassium also improved.  She does not want to change the medication since she is feeling better.  Continue Xanax 1 mg twice a day and Wellbutrin XL 150 mg daily.  She is now on kidney diet.  Recommended to call us back if she has any question or any concern.  Follow-up with 3 months.  Follow Up Instructions:    I discussed the  assessment and treatment plan with the patient. The patient was provided an opportunity to ask questions and all were answered. The patient agreed with the plan and demonstrated an understanding of the instructions.   The patient was advised to call back or seek an in-person evaluation if the symptoms worsen or if the condition fails to improve as anticipated.  Collaboration of Care: Other provider involved in patient's care AEB notes are available in epic to review.  Patient/Guardian was advised Release of Information must be obtained prior to any record release in order to collaborate their care with an outside provider. Patient/Guardian was advised if they have not  already done so to contact the registration department to sign all necessary forms in order for [place] to release information regarding their care.   Consent: Patient/Guardian gives verbal consent for treatment and assignment of benefits for services provided during this visit. Patient/Guardian expressed understanding and agreed to proceed.    I provided 18 minutes of non-face-to-face time during this encounter.   Kathlee Nations, MD

## 2022-04-22 DIAGNOSIS — K219 Gastro-esophageal reflux disease without esophagitis: Secondary | ICD-10-CM | POA: Diagnosis not present

## 2022-04-22 DIAGNOSIS — K2940 Chronic atrophic gastritis without bleeding: Secondary | ICD-10-CM | POA: Diagnosis not present

## 2022-04-22 DIAGNOSIS — E538 Deficiency of other specified B group vitamins: Secondary | ICD-10-CM | POA: Diagnosis not present

## 2022-04-22 DIAGNOSIS — D7589 Other specified diseases of blood and blood-forming organs: Secondary | ICD-10-CM | POA: Diagnosis not present

## 2022-04-22 DIAGNOSIS — R0789 Other chest pain: Secondary | ICD-10-CM | POA: Diagnosis not present

## 2022-04-22 DIAGNOSIS — R1310 Dysphagia, unspecified: Secondary | ICD-10-CM | POA: Diagnosis not present

## 2022-05-07 DIAGNOSIS — K2940 Chronic atrophic gastritis without bleeding: Secondary | ICD-10-CM | POA: Diagnosis not present

## 2022-05-18 DIAGNOSIS — E210 Primary hyperparathyroidism: Secondary | ICD-10-CM | POA: Diagnosis not present

## 2022-05-18 DIAGNOSIS — G35 Multiple sclerosis: Secondary | ICD-10-CM | POA: Diagnosis not present

## 2022-05-18 DIAGNOSIS — N1832 Chronic kidney disease, stage 3b: Secondary | ICD-10-CM | POA: Diagnosis not present

## 2022-05-18 DIAGNOSIS — Z79621 Long term (current) use of calcineurin inhibitor: Secondary | ICD-10-CM | POA: Diagnosis not present

## 2022-05-18 DIAGNOSIS — R1310 Dysphagia, unspecified: Secondary | ICD-10-CM | POA: Diagnosis not present

## 2022-05-18 DIAGNOSIS — Z23 Encounter for immunization: Secondary | ICD-10-CM | POA: Diagnosis not present

## 2022-05-18 DIAGNOSIS — Z79899 Other long term (current) drug therapy: Secondary | ICD-10-CM | POA: Diagnosis not present

## 2022-05-18 DIAGNOSIS — M3214 Glomerular disease in systemic lupus erythematosus: Secondary | ICD-10-CM | POA: Diagnosis not present

## 2022-05-18 DIAGNOSIS — E041 Nontoxic single thyroid nodule: Secondary | ICD-10-CM | POA: Diagnosis not present

## 2022-05-22 DIAGNOSIS — Z23 Encounter for immunization: Secondary | ICD-10-CM | POA: Diagnosis not present

## 2022-05-22 DIAGNOSIS — R5383 Other fatigue: Secondary | ICD-10-CM | POA: Diagnosis not present

## 2022-05-22 DIAGNOSIS — M329 Systemic lupus erythematosus, unspecified: Secondary | ICD-10-CM | POA: Diagnosis not present

## 2022-05-22 DIAGNOSIS — I1310 Hypertensive heart and chronic kidney disease without heart failure, with stage 1 through stage 4 chronic kidney disease, or unspecified chronic kidney disease: Secondary | ICD-10-CM | POA: Diagnosis not present

## 2022-05-22 DIAGNOSIS — R252 Cramp and spasm: Secondary | ICD-10-CM | POA: Diagnosis not present

## 2022-05-22 DIAGNOSIS — R0609 Other forms of dyspnea: Secondary | ICD-10-CM | POA: Diagnosis not present

## 2022-05-22 DIAGNOSIS — N183 Chronic kidney disease, stage 3 unspecified: Secondary | ICD-10-CM | POA: Diagnosis not present

## 2022-05-22 DIAGNOSIS — M351 Other overlap syndromes: Secondary | ICD-10-CM | POA: Diagnosis not present

## 2022-05-22 DIAGNOSIS — F17210 Nicotine dependence, cigarettes, uncomplicated: Secondary | ICD-10-CM | POA: Diagnosis not present

## 2022-05-22 DIAGNOSIS — Q2546 Tortuous aortic arch: Secondary | ICD-10-CM | POA: Diagnosis not present

## 2022-05-22 DIAGNOSIS — I517 Cardiomegaly: Secondary | ICD-10-CM | POA: Diagnosis not present

## 2022-05-22 DIAGNOSIS — M3214 Glomerular disease in systemic lupus erythematosus: Secondary | ICD-10-CM | POA: Diagnosis not present

## 2022-05-22 DIAGNOSIS — Z79899 Other long term (current) drug therapy: Secondary | ICD-10-CM | POA: Diagnosis not present

## 2022-05-22 DIAGNOSIS — R0989 Other specified symptoms and signs involving the circulatory and respiratory systems: Secondary | ICD-10-CM | POA: Diagnosis not present

## 2022-05-28 DIAGNOSIS — R0609 Other forms of dyspnea: Secondary | ICD-10-CM | POA: Diagnosis not present

## 2022-05-28 DIAGNOSIS — M329 Systemic lupus erythematosus, unspecified: Secondary | ICD-10-CM | POA: Diagnosis not present

## 2022-06-05 DIAGNOSIS — R918 Other nonspecific abnormal finding of lung field: Secondary | ICD-10-CM | POA: Diagnosis not present

## 2022-06-05 DIAGNOSIS — J849 Interstitial pulmonary disease, unspecified: Secondary | ICD-10-CM | POA: Diagnosis not present

## 2022-06-05 DIAGNOSIS — M329 Systemic lupus erythematosus, unspecified: Secondary | ICD-10-CM | POA: Diagnosis not present

## 2022-06-23 ENCOUNTER — Ambulatory Visit
Admission: EM | Admit: 2022-06-23 | Discharge: 2022-06-23 | Disposition: A | Discharge status: Home / Self Care | Payer: Medicare Other

## 2022-06-23 ENCOUNTER — Ambulatory Visit (INDEPENDENT_AMBULATORY_CARE_PROVIDER_SITE_OTHER): Payer: Medicare Other

## 2022-06-23 DIAGNOSIS — R0989 Other specified symptoms and signs involving the circulatory and respiratory systems: Secondary | ICD-10-CM

## 2022-06-23 DIAGNOSIS — R059 Cough, unspecified: Secondary | ICD-10-CM | POA: Diagnosis not present

## 2022-06-23 DIAGNOSIS — L0232 Furuncle of buttock: Secondary | ICD-10-CM | POA: Diagnosis not present

## 2022-06-23 DIAGNOSIS — F17200 Nicotine dependence, unspecified, uncomplicated: Secondary | ICD-10-CM

## 2022-06-23 DIAGNOSIS — D849 Immunodeficiency, unspecified: Secondary | ICD-10-CM | POA: Diagnosis not present

## 2022-06-23 DIAGNOSIS — J189 Pneumonia, unspecified organism: Secondary | ICD-10-CM

## 2022-06-23 MED ORDER — CEFDINIR 300 MG PO CAPS: 300.0000 mg | ORAL_CAPSULE | Freq: Two times a day (BID) | ORAL | 0 refills | Status: DC

## 2022-06-23 MED ORDER — AZITHROMYCIN 250 MG PO TABS: ORAL_TABLET | ORAL | 0 refills | Status: DC

## 2022-06-23 NOTE — ED Triage Notes (Signed)
Patient presents to UC for cough since Friday. Taking coricidin. Lower abdominal pain and diarrhea since today. Denies urinary symptoms. She states she has blisters on her rectum area since Friday. Started a new kidney medication x 4 months. Her PCP instructed her to stop the med. She is concerned with possible shingles.   Denies fever.

## 2022-06-23 NOTE — ED Provider Notes (Signed)
[facility]  Note:  This document was prepared using Systems analyst and may include unintentional dictation errors.  MRN: [id number] DOB: 10-Aug-1956  Subjective:   Hazely Sealey is a 66 y.o. female presenting for 4 day history of acute onset chest congestion, wheezing, coughing, belly pain, started having diarrhea, headaches and low back pains today. No chest pain, diaphoresis, left-sided neck pain, left arm pain, fevers, shob. Smokes 1 pack per week. No history of asthma, COPD, HTN, chronic renal insufficiency.  Patient also has concerns about blisterlike lesion that is draining over the buttock area.  Patient was started on CellCept and then subsequently told to stop it due to secondary side effects.  Patient has not tolerated it well.  No bloody stools, history of diverticulitis, Crohn's disease, ulcerative colitis.  No dysuria, urinary frequency.  No history of stroke, heart disease.  No current facility-administered medications for this encounter.  Current Outpatient Medications:    mycophenolate (CELLCEPT) 500 MG tablet, Take by mouth., Disp: , Rfl:    ALPRAZolam (XANAX) 1 MG tablet, Take 1 tablet (1 mg total) by mouth 2 (two) times daily., Disp: 60 tablet, Rfl: 2   buPROPion (WELLBUTRIN XL) 150 MG 24 hr tablet, Take 1 tablet (150 mg total) by mouth daily., Disp: 90 tablet, Rfl: 0   clobetasol (TEMOVATE) 0.05 % external solution, Apply 1 application topically 2 (two) times daily. , Disp: , Rfl:    fluticasone (FLONASE) 50 MCG/ACT nasal spray, Place 1 spray into the nose daily. , Disp: , Rfl:    hydroxychloroquine (PLAQUENIL) 200 MG tablet, Take 200 mg by mouth 2 (two) times daily. , Disp: , Rfl:    omeprazole (PRILOSEC) 20 MG capsule, Take 20 mg by mouth 2 (two) times daily before a meal., Disp: , Rfl:    valsartan (DIOVAN) 80 MG tablet, Take by mouth., Disp: , Rfl:    Allergies  Allergen Reactions   Amoxicillin Anaphylaxis   Demeclocycline  Swelling   Sulfa Antibiotics Anaphylaxis   Tetracycline Other (See Comments)    Unable to walk   Clarithromycin Other (See Comments)    Hypotensin   Hydrocodone-Acetaminophen Rash   Tetracyclines & Related Swelling   Vicodin [Hydrocodone-Acetaminophen] Itching    Past Medical History:  Diagnosis Date   CRI (chronic renal insufficiency)    Headache(784.0)    HTN (hypertension)    Lupus (HCC)    Myositis    Rosacea      Past Surgical History:  Procedure Laterality Date   ABDOMINAL HYSTERECTOMY     TONSILLECTOMY      Family History  Problem Relation Age of Onset   Depression Brother     Social History   Tobacco Use   Smoking status: Every Day    Packs/day: 0.25    Years: 10.00    Total pack years: 2.50    Types: Cigarettes   Smokeless tobacco: Never  Vaping Use   Vaping Use: Never used  Substance Use Topics   Alcohol use: No    Alcohol/week: 0.0 standard drinks of alcohol   Drug use: No    ROS   Objective:   Vitals: BP 119/78 (BP Location: Left Arm)   Pulse 84   Temp 98.4 F (36.9 C) (Oral)   Resp 16   SpO2 95%   Physical Exam Constitutional:      General: She is not in acute distress.    Appearance: Normal appearance. She is well-developed. She is not ill-appearing, toxic-appearing  or diaphoretic.  HENT:     Head: Normocephalic and atraumatic.     Nose: Nose normal.     Mouth/Throat:     Mouth: Mucous membranes are moist.     Pharynx: No oropharyngeal exudate or posterior oropharyngeal erythema.  Eyes:     General: No scleral icterus.       Right eye: No discharge.        Left eye: No discharge.     Extraocular Movements: Extraocular movements intact.     Conjunctiva/sclera: Conjunctivae normal.  Cardiovascular:     Rate and Rhythm: Normal rate and regular rhythm.     Heart sounds: Normal heart sounds. No murmur heard.    No friction rub. No gallop.  Pulmonary:     Effort: Pulmonary effort is normal. No respiratory distress.     Breath  sounds: No stridor. Rhonchi (mid lung fields bilaterally) present. No wheezing or rales.  Chest:     Chest wall: No tenderness.  Abdominal:     General: Bowel sounds are normal. There is no distension.     Palpations: Abdomen is soft. There is no mass.     Tenderness: There is generalized abdominal tenderness and tenderness in the epigastric area, periumbilical area and suprapubic area. There is no right CVA tenderness, left CVA tenderness, guarding or rebound.  Skin:    General: Skin is warm and dry.       Neurological:     General: No focal deficit present.     Mental Status: She is alert and oriented to person, place, and time.  Psychiatric:        Mood and Affect: Mood normal.        Behavior: Behavior normal.        Thought Content: Thought content normal.        Judgment: Judgment normal.    DG Chest 2 View  Result Date: 06/23/2022 CLINICAL DATA:  Cough and congestion. EXAM: CHEST - 2 VIEW COMPARISON:  None Available. FINDINGS: The heart size and mediastinal contours are within normal limits. Coarse interstitial markings. Streaky opacities in the left lower lobe. No focal consolidation, pleural effusion, or pneumothorax. No acute osseous abnormality. IMPRESSION: 1. Streaky opacities in the left lower lobe could reflect atelectasis or pneumonia. Electronically Signed   By: Titus Dubin M.D.   On: 06/23/2022 17:40    Assessment and Plan :   PDMP not reviewed this encounter.  1. Community acquired pneumonia of left lower lobe of lung   2. Boil of buttock   3. Chest congestion   4. Smoker   5. Immunosuppression ([place])     Given medication allergies, will use azithromycin and cefdinir for concurrent treatment of her left lower lobe pneumonia and also the resolving boil.  Patient has taken Keflex, cephalexin without any issues and is okay with trialing cefdinir.  Discussed wound care and dressing changes.  Use supportive care otherwise.  Keep close follow up with PCP. Counseled  patient on potential for adverse effects with medications prescribed/recommended today, maintain strict ER and return-to-clinic precautions discussed, patient verbalized understanding.    Jaynee Eagles, [place] 06/23/22 1801

## 2022-06-24 ENCOUNTER — Telehealth: Payer: Self-pay

## 2022-06-24 NOTE — Telephone Encounter (Addendum)
Patient verification complete (name and date of birth).   Patient called requesting to get prescribed azithromycin changed because she states her gets low when taking the medication. The patient states her PCP-rheumatologist told her we could change the medication to Levofloxacin.  Provider on site made aware.     Around 11:08 am  Patient call returned again after speaking with provider on site here at urgent care. Patient made aware that the provider onsite at the urgent care is not able to prescribe the recommended prescription (per her rheumatologist) due to her age and risk of tendon rupture caused by a prescribing her a fluoroquinolone. Patient advised to reach back out to her primary care provider or rheumatologist to further discuss her x-ray findings, symptoms and what they recommend for antibiotic treatment. All of the patients questions were answered. Patient educated on the importance of reaching out to her PCP for treatment.

## 2022-07-06 DIAGNOSIS — R0602 Shortness of breath: Secondary | ICD-10-CM | POA: Diagnosis not present

## 2022-07-06 DIAGNOSIS — J849 Interstitial pulmonary disease, unspecified: Secondary | ICD-10-CM | POA: Diagnosis not present

## 2022-07-06 DIAGNOSIS — M329 Systemic lupus erythematosus, unspecified: Secondary | ICD-10-CM | POA: Diagnosis not present

## 2022-07-06 DIAGNOSIS — J984 Other disorders of lung: Secondary | ICD-10-CM | POA: Diagnosis not present

## 2022-07-06 DIAGNOSIS — T17500A Unspecified foreign body in bronchus causing asphyxiation, initial encounter: Secondary | ICD-10-CM | POA: Diagnosis not present

## 2022-07-06 DIAGNOSIS — F17200 Nicotine dependence, unspecified, uncomplicated: Secondary | ICD-10-CM | POA: Diagnosis not present

## 2022-07-06 DIAGNOSIS — F17210 Nicotine dependence, cigarettes, uncomplicated: Secondary | ICD-10-CM | POA: Diagnosis not present

## 2022-07-14 ENCOUNTER — Telehealth (HOSPITAL_COMMUNITY): Payer: Medicare Other | Admitting: Psychiatry

## 2022-07-15 ENCOUNTER — Encounter (HOSPITAL_COMMUNITY): Payer: Self-pay | Admitting: Psychiatry

## 2022-07-15 ENCOUNTER — Telehealth (HOSPITAL_BASED_OUTPATIENT_CLINIC_OR_DEPARTMENT_OTHER): Payer: Medicare Other | Admitting: Psychiatry

## 2022-07-15 DIAGNOSIS — F330 Major depressive disorder, recurrent, mild: Secondary | ICD-10-CM | POA: Diagnosis not present

## 2022-07-15 DIAGNOSIS — F419 Anxiety disorder, unspecified: Secondary | ICD-10-CM | POA: Diagnosis not present

## 2022-07-15 MED ORDER — ALPRAZOLAM 1 MG PO TABS: 1.0000 mg | ORAL_TABLET | Freq: Two times a day (BID) | ORAL | 2 refills | Status: DC

## 2022-07-15 MED ORDER — BUPROPION HCL ER (XL) 150 MG PO TB24: 150.0000 mg | ORAL_TABLET | Freq: Every day | ORAL | 0 refills | Status: DC

## 2022-07-15 NOTE — Progress Notes (Signed)
Virtual Visit via Telephone Note  I connected with Jackie Goodman on 07/15/22 at 11:20 AM EST by telephone and verified that I am speaking with the correct person using two identifiers.  Location: Patient: Home Provider: Home Office   I discussed the limitations, risks, security and privacy concerns of performing an evaluation and management service by telephone and the availability of in person appointments. I also discussed with the patient that there may be a patient responsible charge related to this service. The patient expressed understanding and agreed to proceed.   History of Present Illness: Patient is evaluated by phone session.  She reported lately have a lung infection and diagnosed with pneumonia but given the medication azithromycin by a provider but she never took it because she is allergic to medicine azithromycin.  She did contact her [place] doctor and the recommend to try a different medication.  She is doing better.  She was told that she need to stop smoking and since last month she had stopped smoking.  She admitted it was difficult and not easy but she is glad that finally she was able to stop smoking.  She admitted to sleep sometimes not good but also realized that she is taking Wellbutrin at nighttime.  She is taking Xanax 1 mg 2 times a day.  She reported some family issues because her grand child who is 31 year old was disrespectful to her 61 year old grandchild.  He was using crossword and few days before her birthday her 57 year old grandchild apologized.  She reported some time anxiety from the family matters but now lately things are going well.  She recently had a blood work and her creatinine is a stable to 1.6.  Her calcium is 10.3.  She denies any crying spells or any feeling of hopelessness or worthlessness.  She has no tremor or shakes or any EPS.  She started getting diet she feels good.  She is taking mycophenolate and hoping with this new medication and other  medication keep her health stable.  Patient has multiple health issues.  Patient lives with her 71 year old grandchild and her twin sister.  Patient has planned for next family dinner on Thanksgiving.  Patient denies drinking or using any illegal substances.    Past Psychiatric History: H/O depression since lupus in 1993.  Saw psychiatrist at [facility] but not happy with the care.  Seeing this Probation officer since April 2009.  Tried Zoloft in the past.  No h/o inpatient treatment, paranoia or any suicidal attempt.   Psychiatric Specialty Exam: Physical Exam  Review of Systems  Weight 138 lb (62.6 kg).There is no height or weight on file to calculate BMI.  General Appearance: NA  Eye Contact:  NA  Speech:  Clear and Coherent and Normal Rate  Volume:  Normal  Mood:  Anxious  Affect:  NA  Thought Process:  Goal Directed  Orientation:  Full (Time, Place, and Person)  Thought Content:  WDL  Suicidal Thoughts:  No  Homicidal Thoughts:  No  Memory:  Immediate;   Good Recent;   Good Remote;   Good  Judgement:  Intact  Insight:  Present  Psychomotor Activity:  NA  Concentration:  Concentration: Good and Attention Span: Good  Recall:  Good  Fund of Knowledge:  Good  Language:  Good  Akathisia:  No  Handed:  Right  AIMS (if indicated):     Assets:  Communication Skills Desire for Improvement Housing Resilience Social Support Transportation  ADL's:  Intact  Cognition:  WNL  Sleep:   fair      Assessment and Plan: Major depressive disorder, recurrent.  Anxiety.  I reviewed blood work results.  Her creatinine is stable.  She stopped smoking and she feels proud about that.  She does not want to change the medication.  I recommend to take the Wellbutrin in the morning as taking nighttime may cause insomnia.  She agreed with the plan.  Patient is now on kidney diet and taking a new medication for her kidney.  Patient is not interested in therapy since things are going well.  We will continue  Xanax 1 mg 2 times a day and she will take the Wellbutrin XL 150 mg in the morning.  Recommended to call us back if she has any question or any concern.  Follow-up in 3 months.  Follow Up Instructions:    I discussed the assessment and treatment plan with the patient. The patient was provided an opportunity to ask questions and all were answered. The patient agreed with the plan and demonstrated an understanding of the instructions.   The patient was advised to call back or seek an in-person evaluation if the symptoms worsen or if the condition fails to improve as anticipated.  Collaboration of Care: Other provider involved in patient's care AEB notes are available in epic to review.  Patient/Guardian was advised Release of Information must be obtained prior to any record release in order to collaborate their care with an outside provider. Patient/Guardian was advised if they have not already done so to contact the registration department to sign all necessary forms in order for [place] to release information regarding their care.   Consent: Patient/Guardian gives verbal consent for treatment and assignment of benefits for services provided during this visit. Patient/Guardian expressed understanding and agreed to proceed.    I provided 20 minutes of non-face-to-face time during this encounter.   Kathlee Nations, MD

## 2022-07-20 ENCOUNTER — Telehealth (HOSPITAL_COMMUNITY): Payer: Medicare Other | Admitting: Psychiatry

## 2022-08-13 ENCOUNTER — Telehealth (HOSPITAL_COMMUNITY): Payer: Self-pay

## 2022-08-13 NOTE — Telephone Encounter (Signed)
Patient called to request a refill please advise        Disp Refills Start End   ALPRAZolam (XANAX) 1 MG tablet 60 tablet 2 07/15/2022    Sig - Route: Take 1 tablet (1 mg total) by mouth 2 (two) times daily. - Oral   Sent to pharmacy as: ALPRAZolam Duanne Moron) 1 MG tablet   Notes to Pharmacy: No early refill   E-Prescribing Status: Receipt confirmed by pharmacy (07/15/2022 11:40 AM EST)

## 2022-08-13 NOTE — Telephone Encounter (Signed)
She was given a prescription on November 15 with 2 additional refills.  She need to call the pharmacy.

## 2022-08-14 DIAGNOSIS — M329 Systemic lupus erythematosus, unspecified: Secondary | ICD-10-CM | POA: Diagnosis not present

## 2022-09-07 DIAGNOSIS — J849 Interstitial pulmonary disease, unspecified: Secondary | ICD-10-CM | POA: Diagnosis not present

## 2022-09-07 DIAGNOSIS — R918 Other nonspecific abnormal finding of lung field: Secondary | ICD-10-CM | POA: Diagnosis not present

## 2022-09-07 DIAGNOSIS — J984 Other disorders of lung: Secondary | ICD-10-CM | POA: Diagnosis not present

## 2022-09-16 DIAGNOSIS — Z79899 Other long term (current) drug therapy: Secondary | ICD-10-CM | POA: Diagnosis not present

## 2022-09-16 DIAGNOSIS — H25813 Combined forms of age-related cataract, bilateral: Secondary | ICD-10-CM | POA: Diagnosis not present

## 2022-10-07 DIAGNOSIS — R801 Persistent proteinuria, unspecified: Secondary | ICD-10-CM | POA: Diagnosis not present

## 2022-10-07 DIAGNOSIS — I10 Essential (primary) hypertension: Secondary | ICD-10-CM | POA: Diagnosis not present

## 2022-10-07 DIAGNOSIS — N1832 Chronic kidney disease, stage 3b: Secondary | ICD-10-CM | POA: Diagnosis not present

## 2022-10-14 ENCOUNTER — Encounter (HOSPITAL_COMMUNITY): Payer: Self-pay | Admitting: Psychiatry

## 2022-10-14 ENCOUNTER — Telehealth (HOSPITAL_BASED_OUTPATIENT_CLINIC_OR_DEPARTMENT_OTHER): Payer: 59 | Admitting: Psychiatry

## 2022-10-14 DIAGNOSIS — F330 Major depressive disorder, recurrent, mild: Secondary | ICD-10-CM

## 2022-10-14 DIAGNOSIS — F419 Anxiety disorder, unspecified: Secondary | ICD-10-CM

## 2022-10-14 MED ORDER — ALPRAZOLAM 1 MG PO TABS: 1.0000 mg | ORAL_TABLET | Freq: Two times a day (BID) | ORAL | 2 refills | Status: DC

## 2022-10-14 MED ORDER — BUPROPION HCL ER (XL) 150 MG PO TB24: 150.0000 mg | ORAL_TABLET | Freq: Every day | ORAL | 0 refills | Status: DC

## 2022-10-14 NOTE — Progress Notes (Signed)
Virtual Visit via Telephone Note  I connected with Jackie Goodman on 10/14/22 at 10:00 AM EST by telephone and verified that I am speaking with the correct person using two identifiers.  Location: Patient: Home Provider: Home Office   I discussed the limitations, risks, security and privacy concerns of performing an evaluation and management service by telephone and the availability of in person appointments. I also discussed with the patient that there may be a patient responsible charge related to this service. The patient expressed understanding and agreed to proceed.   History of Present Illness: Patient is evaluated by phone session.  She reported family stress as her niece recently diagnosed with breast cancer.  Patient lives with her twin sister and that is her daughter.  She also concern about her own health issues as recently her visit with the kidney doctor shows that her creatinine is higher than before.  She is taking Xanax and Wellbutrin which is keeping her anxiety and depression stable but there are times when she think about her family members and her personal health issues that made her dysphoric.  She reported her 68 year old grandchild is now doing much better with 34 year old grandchild.  She denies any crying spells or any feeling of hopelessness or worthlessness.  She denies any suicidal thoughts.  Her appetite is okay but energy level is low.  Patient has chronic kidney disease and diagnosed with lupus.  She has no tremor or shakes or any EPS.  She denies drinking or using any illegal substances.  She denies any crying spells.  She had a good support from family members.  Past Psychiatric History: H/O depression since lupus in 1993.  Saw psychiatrist at [facility] but not happy with the care.  Seeing this Probation officer since April 2009.  Tried Zoloft in the past.  No h/o inpatient treatment, paranoia or any suicidal attempt.   Psychiatric Specialty Exam: Physical Exam  Review of Systems   Weight 143 lb (64.9 kg).There is no height or weight on file to calculate BMI.  General Appearance: NA  Eye Contact:  NA  Speech:  Normal Rate  Volume:  Decreased  Mood:  Dysphoric  Affect:  NA  Thought Process:  Goal Directed  Orientation:  Full (Time, Place, and Person)  Thought Content:  Rumination  Suicidal Thoughts:  No  Homicidal Thoughts:  No  Memory:  Immediate;   Good Recent;   Good Remote;   Fair  Judgement:  Intact  Insight:  Present  Psychomotor Activity:  NA  Concentration:  Concentration: Good and Attention Span: Good  Recall:  Good  Fund of Knowledge:  Good  Language:  Good  Akathisia:  No  Handed:  Right  AIMS (if indicated):     Assets:  Communication Skills Desire for Improvement Housing Social Support Transportation  ADL's:  Intact  Cognition:  WNL  Sleep:   fair      Assessment and Plan: Major depressive disorder, recurrent.  Anxiety.  Discuss family stress, review blood work results and current medication.  Her last creatinine is 2.3 and BUN 27.  Patient reported medicine is working and symptoms are manageable.  I encouraged again to think about therapy as more family stress.  Patient promised to look into it and give [place] a call if she needed.  Continue Xanax 1 mg 2 times a day and Wellbutrin XL 150 mg in the morning.  Recommended to call us back if she has any question or any concern.  Follow-up in 3  months.  Follow Up Instructions:    I discussed the assessment and treatment plan with the patient. The patient was provided an opportunity to ask questions and all were answered. The patient agreed with the plan and demonstrated an understanding of the instructions.   The patient was advised to call back or seek an in-person evaluation if the symptoms worsen or if the condition fails to improve as anticipated.  Collaboration of Care: Other provider involved in patient's care AEB notes are available in epic to review.  Patient/Guardian was advised  Release of Information must be obtained prior to any record release in order to collaborate their care with an outside provider. Patient/Guardian was advised if they have not already done so to contact the registration department to sign all necessary forms in order for [place] to release information regarding their care.   Consent: Patient/Guardian gives verbal consent for treatment and assignment of benefits for services provided during this visit. Patient/Guardian expressed understanding and agreed to proceed.    I provided 18 minutes of non-face-to-face time during this encounter.   Kathlee Nations, MD

## 2022-10-19 DIAGNOSIS — Z79899 Other long term (current) drug therapy: Secondary | ICD-10-CM | POA: Diagnosis not present

## 2022-11-05 DIAGNOSIS — R0602 Shortness of breath: Secondary | ICD-10-CM | POA: Diagnosis not present

## 2022-11-05 DIAGNOSIS — J849 Interstitial pulmonary disease, unspecified: Secondary | ICD-10-CM | POA: Diagnosis not present

## 2022-11-05 DIAGNOSIS — Z79899 Other long term (current) drug therapy: Secondary | ICD-10-CM | POA: Diagnosis not present

## 2022-11-20 DIAGNOSIS — K219 Gastro-esophageal reflux disease without esophagitis: Secondary | ICD-10-CM | POA: Diagnosis not present

## 2022-11-20 DIAGNOSIS — M3214 Glomerular disease in systemic lupus erythematosus: Secondary | ICD-10-CM | POA: Diagnosis not present

## 2022-11-20 DIAGNOSIS — R350 Frequency of micturition: Secondary | ICD-10-CM | POA: Diagnosis not present

## 2022-11-20 DIAGNOSIS — N184 Chronic kidney disease, stage 4 (severe): Secondary | ICD-10-CM | POA: Diagnosis not present

## 2022-11-20 DIAGNOSIS — E872 Acidosis, unspecified: Secondary | ICD-10-CM | POA: Diagnosis not present

## 2022-11-20 DIAGNOSIS — R0602 Shortness of breath: Secondary | ICD-10-CM | POA: Diagnosis not present

## 2022-11-20 DIAGNOSIS — T465X5A Adverse effect of other antihypertensive drugs, initial encounter: Secondary | ICD-10-CM | POA: Diagnosis not present

## 2022-11-20 DIAGNOSIS — I1310 Hypertensive heart and chronic kidney disease without heart failure, with stage 1 through stage 4 chronic kidney disease, or unspecified chronic kidney disease: Secondary | ICD-10-CM | POA: Diagnosis not present

## 2022-11-20 DIAGNOSIS — M7581 Other shoulder lesions, right shoulder: Secondary | ICD-10-CM | POA: Diagnosis not present

## 2022-11-20 DIAGNOSIS — Z87891 Personal history of nicotine dependence: Secondary | ICD-10-CM | POA: Diagnosis not present

## 2022-11-20 DIAGNOSIS — Z881 Allergy status to other antibiotic agents status: Secondary | ICD-10-CM | POA: Diagnosis not present

## 2022-11-20 DIAGNOSIS — E162 Hypoglycemia, unspecified: Secondary | ICD-10-CM | POA: Diagnosis not present

## 2022-11-20 DIAGNOSIS — Z882 Allergy status to sulfonamides status: Secondary | ICD-10-CM | POA: Diagnosis not present

## 2022-11-20 DIAGNOSIS — Z880 Allergy status to penicillin: Secondary | ICD-10-CM | POA: Diagnosis not present

## 2022-11-20 DIAGNOSIS — Z79899 Other long term (current) drug therapy: Secondary | ICD-10-CM | POA: Diagnosis not present

## 2022-11-20 DIAGNOSIS — M329 Systemic lupus erythematosus, unspecified: Secondary | ICD-10-CM | POA: Diagnosis not present

## 2022-11-20 DIAGNOSIS — Z885 Allergy status to narcotic agent status: Secondary | ICD-10-CM | POA: Diagnosis not present

## 2022-11-20 DIAGNOSIS — R519 Headache, unspecified: Secondary | ICD-10-CM | POA: Diagnosis not present

## 2022-11-20 DIAGNOSIS — D84821 Immunodeficiency due to drugs: Secondary | ICD-10-CM | POA: Diagnosis not present

## 2022-11-20 DIAGNOSIS — E875 Hyperkalemia: Secondary | ICD-10-CM | POA: Diagnosis not present

## 2022-11-20 DIAGNOSIS — J849 Interstitial pulmonary disease, unspecified: Secondary | ICD-10-CM | POA: Diagnosis not present

## 2022-11-20 DIAGNOSIS — M25511 Pain in right shoulder: Secondary | ICD-10-CM | POA: Diagnosis not present

## 2022-11-26 DIAGNOSIS — R7303 Prediabetes: Secondary | ICD-10-CM | POA: Diagnosis not present

## 2022-11-26 DIAGNOSIS — I10 Essential (primary) hypertension: Secondary | ICD-10-CM | POA: Diagnosis not present

## 2022-11-26 DIAGNOSIS — E039 Hypothyroidism, unspecified: Secondary | ICD-10-CM | POA: Diagnosis not present

## 2022-11-26 DIAGNOSIS — M328 Other forms of systemic lupus erythematosus: Secondary | ICD-10-CM | POA: Diagnosis not present

## 2022-11-30 DIAGNOSIS — R059 Cough, unspecified: Secondary | ICD-10-CM | POA: Diagnosis not present

## 2022-11-30 DIAGNOSIS — N184 Chronic kidney disease, stage 4 (severe): Secondary | ICD-10-CM | POA: Diagnosis not present

## 2022-11-30 DIAGNOSIS — I129 Hypertensive chronic kidney disease with stage 1 through stage 4 chronic kidney disease, or unspecified chronic kidney disease: Secondary | ICD-10-CM | POA: Diagnosis not present

## 2022-11-30 DIAGNOSIS — M328 Other forms of systemic lupus erythematosus: Secondary | ICD-10-CM | POA: Diagnosis not present

## 2022-11-30 DIAGNOSIS — M25511 Pain in right shoulder: Secondary | ICD-10-CM | POA: Diagnosis not present

## 2022-11-30 DIAGNOSIS — E875 Hyperkalemia: Secondary | ICD-10-CM | POA: Diagnosis not present

## 2022-12-07 DIAGNOSIS — R0602 Shortness of breath: Secondary | ICD-10-CM | POA: Diagnosis not present

## 2022-12-07 DIAGNOSIS — N184 Chronic kidney disease, stage 4 (severe): Secondary | ICD-10-CM | POA: Diagnosis not present

## 2022-12-07 DIAGNOSIS — M25511 Pain in right shoulder: Secondary | ICD-10-CM | POA: Diagnosis not present

## 2022-12-11 ENCOUNTER — Other Ambulatory Visit: Payer: Self-pay | Admitting: Family Medicine

## 2022-12-11 ENCOUNTER — Ambulatory Visit
Admission: RE | Admit: 2022-12-11 | Discharge: 2022-12-11 | Disposition: A | Discharge status: Home / Self Care | Payer: 59 | Source: Ambulatory Visit | Attending: Family Medicine | Admitting: Family Medicine

## 2022-12-11 DIAGNOSIS — R0602 Shortness of breath: Secondary | ICD-10-CM

## 2022-12-11 DIAGNOSIS — M25511 Pain in right shoulder: Secondary | ICD-10-CM

## 2022-12-11 DIAGNOSIS — R059 Cough, unspecified: Secondary | ICD-10-CM | POA: Diagnosis not present

## 2023-01-04 DIAGNOSIS — N184 Chronic kidney disease, stage 4 (severe): Secondary | ICD-10-CM | POA: Diagnosis not present

## 2023-01-06 DIAGNOSIS — M25511 Pain in right shoulder: Secondary | ICD-10-CM | POA: Diagnosis not present

## 2023-01-06 DIAGNOSIS — I10 Essential (primary) hypertension: Secondary | ICD-10-CM | POA: Diagnosis not present

## 2023-01-06 DIAGNOSIS — N184 Chronic kidney disease, stage 4 (severe): Secondary | ICD-10-CM | POA: Diagnosis not present

## 2023-01-12 ENCOUNTER — Telehealth (HOSPITAL_BASED_OUTPATIENT_CLINIC_OR_DEPARTMENT_OTHER): Payer: 59 | Admitting: Psychiatry

## 2023-01-12 ENCOUNTER — Encounter (HOSPITAL_COMMUNITY): Payer: Self-pay | Admitting: Psychiatry

## 2023-01-12 VITALS — Wt 140.0 lb

## 2023-01-12 DIAGNOSIS — F330 Major depressive disorder, recurrent, mild: Secondary | ICD-10-CM | POA: Diagnosis not present

## 2023-01-12 DIAGNOSIS — F419 Anxiety disorder, unspecified: Secondary | ICD-10-CM | POA: Diagnosis not present

## 2023-01-12 MED ORDER — ALPRAZOLAM 1 MG PO TABS: 1.0000 mg | ORAL_TABLET | Freq: Two times a day (BID) | ORAL | 2 refills | Status: DC

## 2023-01-12 MED ORDER — BUPROPION HCL ER (XL) 150 MG PO TB24: 150.0000 mg | ORAL_TABLET | Freq: Every day | ORAL | 0 refills | Status: DC

## 2023-01-12 NOTE — Progress Notes (Signed)
[facility] MD Virtual Progress Note   Patient Location: Home Provider Location: Home Office  I connect with patient by telephone and verified that I am speaking with correct person by using two identifiers. I discussed the limitations of evaluation and management by telemedicine and the availability of in person appointments. I also discussed with the patient that there may be a patient responsible charge related to this service. The patient expressed understanding and agreed to proceed.  Jackie Goodman [id number] 67 y.o.  01/12/2023 3:00 PM  History of Present Illness:  Patient is evaluated by phone session.  She reported a lot of anxiety and nervousness 2 months ago.  She had a visit with her nephrologist at [facility] and after the visit when she is coming back her doctor called back and recommend to go to the emergency room because of high potassium.  Patient was admitted for few days due to very high potassium.  She was treated acutely there to bring down her potassium.  Patient told it was very stressful.  Her medicines were changed.  She is now on lisinopril, mycophenolate and also taking medicine to lower the potassium.  She is now restricted potassium diet.  She lost 2 pounds.  She is not supposed to eat potatoes, tomatoes, bananas and cut down her sweets.  She admitted has been stressful but manageable.  She also reported chronic family issues at her niece who diagnosed with breast cancer now have bilateral mastectomy.  She is trying to be as supportive as she can.  She denies any crying spells or any feeling of hopelessness or worthlessness.  She denies any suicidal thoughts.  She stage IV kidney disease.  She is on mycophenolate and trying to get her blood work improved.  Her last creatinine was 2.4 and BUN 29.  Her potassium is 5.2 but it has been up to 6.1.  She is taking Wellbutrin and Xanax.  She had a good support from family.  She is not interested in therapy.  Past  Psychiatric History: H/O depression since lupus in 1993.  Saw psychiatrist at [facility] but not happy with the care.  Seeing this Clinical research associate since April 2009.  Tried Zoloft in the past.  No h/o inpatient treatment, paranoia or any suicidal attempt.    Outpatient Encounter Medications as of 01/12/2023  Medication Sig   ALPRAZolam (XANAX) 1 MG tablet Take 1 tablet (1 mg total) by mouth 2 (two) times daily.   azithromycin (ZITHROMAX) 250 MG tablet Day 1: take 2 tablets. Day 2-5: Take 1 tablet daily. (Patient not taking: Reported on 07/15/2022)   buPROPion (WELLBUTRIN XL) 150 MG 24 hr tablet Take 1 tablet (150 mg total) by mouth daily.   cefdinir (OMNICEF) 300 MG capsule Take 1 capsule (300 mg total) by mouth 2 (two) times daily.   clobetasol (TEMOVATE) 0.05 % external solution Apply 1 application topically 2 (two) times daily.    fluticasone (FLONASE) 50 MCG/ACT nasal spray Place 1 spray into the nose daily.    hydroxychloroquine (PLAQUENIL) 200 MG tablet Take 200 mg by mouth 2 (two) times daily.    omeprazole (PRILOSEC) 20 MG capsule Take 20 mg by mouth 2 (two) times daily before a meal.   valsartan (DIOVAN) 80 MG tablet Take by mouth.   No facility-administered encounter medications on file as of 01/12/2023.    No results found for this or any previous visit (from the past 2160 hour(s)).   Psychiatric Specialty Exam: Physical Exam  Review of Systems  Constitutional:  Positive for appetite change.    Weight 140 lb (63.5 kg).There is no height or weight on file to calculate BMI.  General Appearance: NA  Eye Contact:  NA  Speech:  Slow  Volume:  Normal  Mood:  Anxious  Affect:  NA  Thought Process:  Goal Directed  Orientation:  Full (Time, Place, and Person)  Thought Content:  Rumination  Suicidal Thoughts:  No  Homicidal Thoughts:  No  Memory:  Immediate;   Good Recent;   Good Remote;   Good  Judgement:  Intact  Insight:  Present  Psychomotor Activity:  NA  Concentration:   Concentration: Good and Attention Span: Good  Recall:  Good  Fund of Knowledge:  Good  Language:  Good  Akathisia:  No  Handed:  Right  AIMS (if indicated):     Assets:  Communication Skills Desire for Improvement Housing Social Support Transportation  ADL's:  Intact  Cognition:  WNL  Sleep:  fair     Assessment/Plan: Mild episode of recurrent major depressive disorder (HCC) - Plan: buPROPion (WELLBUTRIN XL) 150 MG 24 hr tablet, ALPRAZolam (XANAX) 1 MG tablet  Anxiety - Plan: ALPRAZolam (XANAX) 1 MG tablet  Discussed family stress, reviewed blood work results and collateral information from care everywhere.  Labs reviewed.  Patient is taking different blood pressure medicine.  She was prescribed muscle relaxant but she finished.  She also prescribed prednisone which she completed the course.  She is on mycophenolate and seeing nephrologist in primary care on a regular basis.  I offered therapy but patient is not interested because she had a good support from her family.  I reconciled the medication.  Patient like to keep the current medicine.  Continue Wellbutrin XL 150 mg in the morning and Xanax 1 mg 2 times a day.  Discussed benzodiazepine dependence tolerance and withdrawal.  I recommend if she feels that she need to see a therapist then she can let us know.  Recommend to call us back if she has any question otherwise we will follow-up in 3 months.   Follow Up Instructions:     I discussed the assessment and treatment plan with the patient. The patient was provided an opportunity to ask questions and all were answered. The patient agreed with the plan and demonstrated an understanding of the instructions.   The patient was advised to call back or seek an in-person evaluation if the symptoms worsen or if the condition fails to improve as anticipated.    Collaboration of Care: Other provider involved in patient's care AEB notes are available in epic to review.  Patient/Guardian  was advised Release of Information must be obtained prior to any record release in order to collaborate their care with an outside provider. Patient/Guardian was advised if they have not already done so to contact the registration department to sign all necessary forms in order for [place] to release information regarding their care.   Consent: Patient/Guardian gives verbal consent for treatment and assignment of benefits for services provided during this visit. Patient/Guardian expressed understanding and agreed to proceed.     I provided 28 minutes of non face to face time during this encounter.  Note: This document was prepared by [employer] voice dictation technology and any errors that results from this process are unintentional.    Cleotis Nipper, MD 01/12/2023

## 2023-01-19 DIAGNOSIS — R21 Rash and other nonspecific skin eruption: Secondary | ICD-10-CM | POA: Diagnosis not present

## 2023-01-29 DIAGNOSIS — N184 Chronic kidney disease, stage 4 (severe): Secondary | ICD-10-CM | POA: Diagnosis not present

## 2023-01-29 DIAGNOSIS — I129 Hypertensive chronic kidney disease with stage 1 through stage 4 chronic kidney disease, or unspecified chronic kidney disease: Secondary | ICD-10-CM | POA: Diagnosis not present

## 2023-02-12 DIAGNOSIS — M3219 Other organ or system involvement in systemic lupus erythematosus: Secondary | ICD-10-CM | POA: Diagnosis not present

## 2023-02-12 DIAGNOSIS — J849 Interstitial pulmonary disease, unspecified: Secondary | ICD-10-CM | POA: Diagnosis not present

## 2023-02-12 DIAGNOSIS — R0609 Other forms of dyspnea: Secondary | ICD-10-CM | POA: Diagnosis not present

## 2023-02-12 DIAGNOSIS — M329 Systemic lupus erythematosus, unspecified: Secondary | ICD-10-CM | POA: Diagnosis not present

## 2023-02-16 DIAGNOSIS — N184 Chronic kidney disease, stage 4 (severe): Secondary | ICD-10-CM | POA: Diagnosis not present

## 2023-02-16 DIAGNOSIS — M3219 Other organ or system involvement in systemic lupus erythematosus: Secondary | ICD-10-CM | POA: Diagnosis not present

## 2023-02-22 DIAGNOSIS — L710 Perioral dermatitis: Secondary | ICD-10-CM | POA: Diagnosis not present

## 2023-02-22 DIAGNOSIS — L811 Chloasma: Secondary | ICD-10-CM | POA: Diagnosis not present

## 2023-02-22 DIAGNOSIS — M329 Systemic lupus erythematosus, unspecified: Secondary | ICD-10-CM | POA: Diagnosis not present

## 2023-03-02 DIAGNOSIS — K2940 Chronic atrophic gastritis without bleeding: Secondary | ICD-10-CM | POA: Diagnosis not present

## 2023-03-02 DIAGNOSIS — R197 Diarrhea, unspecified: Secondary | ICD-10-CM | POA: Diagnosis not present

## 2023-03-02 DIAGNOSIS — D7589 Other specified diseases of blood and blood-forming organs: Secondary | ICD-10-CM | POA: Diagnosis not present

## 2023-03-02 DIAGNOSIS — R110 Nausea: Secondary | ICD-10-CM | POA: Diagnosis not present

## 2023-03-02 DIAGNOSIS — R1310 Dysphagia, unspecified: Secondary | ICD-10-CM | POA: Diagnosis not present

## 2023-03-02 DIAGNOSIS — E538 Deficiency of other specified B group vitamins: Secondary | ICD-10-CM | POA: Diagnosis not present

## 2023-03-02 DIAGNOSIS — K219 Gastro-esophageal reflux disease without esophagitis: Secondary | ICD-10-CM | POA: Diagnosis not present

## 2023-03-02 DIAGNOSIS — R1319 Other dysphagia: Secondary | ICD-10-CM | POA: Diagnosis not present

## 2023-03-03 DIAGNOSIS — I10 Essential (primary) hypertension: Secondary | ICD-10-CM | POA: Diagnosis not present

## 2023-03-03 DIAGNOSIS — E875 Hyperkalemia: Secondary | ICD-10-CM | POA: Diagnosis not present

## 2023-03-03 DIAGNOSIS — M3219 Other organ or system involvement in systemic lupus erythematosus: Secondary | ICD-10-CM | POA: Diagnosis not present

## 2023-03-03 DIAGNOSIS — N185 Chronic kidney disease, stage 5: Secondary | ICD-10-CM | POA: Diagnosis not present

## 2023-03-03 DIAGNOSIS — K219 Gastro-esophageal reflux disease without esophagitis: Secondary | ICD-10-CM | POA: Diagnosis not present

## 2023-03-03 DIAGNOSIS — D519 Vitamin B12 deficiency anemia, unspecified: Secondary | ICD-10-CM | POA: Diagnosis not present

## 2023-03-09 DIAGNOSIS — D84821 Immunodeficiency due to drugs: Secondary | ICD-10-CM | POA: Diagnosis not present

## 2023-03-09 DIAGNOSIS — R0609 Other forms of dyspnea: Secondary | ICD-10-CM | POA: Diagnosis not present

## 2023-03-09 DIAGNOSIS — J849 Interstitial pulmonary disease, unspecified: Secondary | ICD-10-CM | POA: Diagnosis not present

## 2023-03-09 DIAGNOSIS — R0602 Shortness of breath: Secondary | ICD-10-CM | POA: Diagnosis not present

## 2023-03-09 DIAGNOSIS — Z79624 Long term (current) use of inhibitors of nucleotide synthesis: Secondary | ICD-10-CM | POA: Diagnosis not present

## 2023-03-09 DIAGNOSIS — Z7182 Exercise counseling: Secondary | ICD-10-CM | POA: Diagnosis not present

## 2023-03-09 DIAGNOSIS — Z79899 Other long term (current) drug therapy: Secondary | ICD-10-CM | POA: Diagnosis not present

## 2023-03-09 DIAGNOSIS — M329 Systemic lupus erythematosus, unspecified: Secondary | ICD-10-CM | POA: Diagnosis not present

## 2023-03-09 DIAGNOSIS — R918 Other nonspecific abnormal finding of lung field: Secondary | ICD-10-CM | POA: Diagnosis not present

## 2023-03-09 DIAGNOSIS — Z87891 Personal history of nicotine dependence: Secondary | ICD-10-CM | POA: Diagnosis not present

## 2023-03-09 DIAGNOSIS — M351 Other overlap syndromes: Secondary | ICD-10-CM | POA: Diagnosis not present

## 2023-03-16 DIAGNOSIS — R110 Nausea: Secondary | ICD-10-CM | POA: Diagnosis not present

## 2023-03-17 DIAGNOSIS — M3219 Other organ or system involvement in systemic lupus erythematosus: Secondary | ICD-10-CM | POA: Diagnosis not present

## 2023-03-17 DIAGNOSIS — I10 Essential (primary) hypertension: Secondary | ICD-10-CM | POA: Diagnosis not present

## 2023-03-17 DIAGNOSIS — M25511 Pain in right shoulder: Secondary | ICD-10-CM | POA: Diagnosis not present

## 2023-03-18 ENCOUNTER — Other Ambulatory Visit: Payer: Self-pay | Admitting: Family Medicine

## 2023-03-18 DIAGNOSIS — S40851A Superficial foreign body of right upper arm, initial encounter: Secondary | ICD-10-CM

## 2023-03-24 DIAGNOSIS — I10 Essential (primary) hypertension: Secondary | ICD-10-CM | POA: Diagnosis not present

## 2023-03-25 ENCOUNTER — Other Ambulatory Visit: Payer: 59

## 2023-03-31 DIAGNOSIS — J984 Other disorders of lung: Secondary | ICD-10-CM | POA: Diagnosis not present

## 2023-03-31 DIAGNOSIS — J849 Interstitial pulmonary disease, unspecified: Secondary | ICD-10-CM | POA: Diagnosis not present

## 2023-03-31 DIAGNOSIS — R918 Other nonspecific abnormal finding of lung field: Secondary | ICD-10-CM | POA: Diagnosis not present

## 2023-04-02 ENCOUNTER — Ambulatory Visit
Admission: RE | Admit: 2023-04-02 | Discharge: 2023-04-02 | Disposition: A | Discharge status: Home / Self Care | Payer: 59 | Source: Ambulatory Visit | Attending: Family Medicine | Admitting: Family Medicine

## 2023-04-02 DIAGNOSIS — R2231 Localized swelling, mass and lump, right upper limb: Secondary | ICD-10-CM | POA: Diagnosis not present

## 2023-04-02 DIAGNOSIS — S40851A Superficial foreign body of right upper arm, initial encounter: Secondary | ICD-10-CM

## 2023-04-02 DIAGNOSIS — M79601 Pain in right arm: Secondary | ICD-10-CM | POA: Diagnosis not present

## 2023-04-13 ENCOUNTER — Other Ambulatory Visit (HOSPITAL_COMMUNITY): Payer: Self-pay

## 2023-04-13 DIAGNOSIS — F330 Major depressive disorder, recurrent, mild: Secondary | ICD-10-CM

## 2023-04-13 DIAGNOSIS — F419 Anxiety disorder, unspecified: Secondary | ICD-10-CM

## 2023-04-13 MED ORDER — BUPROPION HCL ER (XL) 150 MG PO TB24
150.0000 mg | ORAL_TABLET | Freq: Every day | ORAL | 0 refills | Status: DC
Start: 2023-04-13 — End: 2023-05-18

## 2023-04-13 MED ORDER — ALPRAZOLAM 1 MG PO TABS
1.0000 mg | ORAL_TABLET | Freq: Two times a day (BID) | ORAL | 0 refills | Status: DC
Start: 2023-04-13 — End: 2023-05-18

## 2023-04-14 ENCOUNTER — Telehealth (HOSPITAL_COMMUNITY): Payer: 59 | Admitting: Psychiatry

## 2023-04-14 DIAGNOSIS — K219 Gastro-esophageal reflux disease without esophagitis: Secondary | ICD-10-CM | POA: Diagnosis not present

## 2023-04-14 DIAGNOSIS — M3210 Systemic lupus erythematosus, organ or system involvement unspecified: Secondary | ICD-10-CM | POA: Diagnosis not present

## 2023-04-14 DIAGNOSIS — M25511 Pain in right shoulder: Secondary | ICD-10-CM | POA: Diagnosis not present

## 2023-04-14 DIAGNOSIS — R051 Acute cough: Secondary | ICD-10-CM | POA: Diagnosis not present

## 2023-04-14 DIAGNOSIS — N184 Chronic kidney disease, stage 4 (severe): Secondary | ICD-10-CM | POA: Diagnosis not present

## 2023-04-22 DIAGNOSIS — I10 Essential (primary) hypertension: Secondary | ICD-10-CM | POA: Diagnosis not present

## 2023-04-22 DIAGNOSIS — D631 Anemia in chronic kidney disease: Secondary | ICD-10-CM | POA: Diagnosis not present

## 2023-04-22 DIAGNOSIS — R801 Persistent proteinuria, unspecified: Secondary | ICD-10-CM | POA: Diagnosis not present

## 2023-04-22 DIAGNOSIS — N1832 Chronic kidney disease, stage 3b: Secondary | ICD-10-CM | POA: Diagnosis not present

## 2023-04-22 DIAGNOSIS — N184 Chronic kidney disease, stage 4 (severe): Secondary | ICD-10-CM | POA: Diagnosis not present

## 2023-05-05 DIAGNOSIS — K224 Dyskinesia of esophagus: Secondary | ICD-10-CM | POA: Diagnosis not present

## 2023-05-05 DIAGNOSIS — R1310 Dysphagia, unspecified: Secondary | ICD-10-CM | POA: Diagnosis not present

## 2023-05-14 ENCOUNTER — Other Ambulatory Visit (HOSPITAL_COMMUNITY): Payer: Self-pay | Admitting: Psychiatry

## 2023-05-14 DIAGNOSIS — F419 Anxiety disorder, unspecified: Secondary | ICD-10-CM

## 2023-05-14 DIAGNOSIS — M4692 Unspecified inflammatory spondylopathy, cervical region: Secondary | ICD-10-CM | POA: Diagnosis not present

## 2023-05-14 DIAGNOSIS — M25511 Pain in right shoulder: Secondary | ICD-10-CM | POA: Diagnosis not present

## 2023-05-14 DIAGNOSIS — N2889 Other specified disorders of kidney and ureter: Secondary | ICD-10-CM | POA: Diagnosis not present

## 2023-05-14 DIAGNOSIS — F330 Major depressive disorder, recurrent, mild: Secondary | ICD-10-CM

## 2023-05-14 DIAGNOSIS — N281 Cyst of kidney, acquired: Secondary | ICD-10-CM | POA: Diagnosis not present

## 2023-05-18 ENCOUNTER — Other Ambulatory Visit (HOSPITAL_COMMUNITY): Payer: Self-pay

## 2023-05-18 DIAGNOSIS — F419 Anxiety disorder, unspecified: Secondary | ICD-10-CM

## 2023-05-18 DIAGNOSIS — F330 Major depressive disorder, recurrent, mild: Secondary | ICD-10-CM

## 2023-05-18 MED ORDER — BUPROPION HCL ER (XL) 150 MG PO TB24: 150.0000 mg | ORAL_TABLET | Freq: Every day | ORAL | 0 refills | Status: DC

## 2023-05-18 MED ORDER — ALPRAZOLAM 1 MG PO TABS
1.0000 mg | ORAL_TABLET | Freq: Two times a day (BID) | ORAL | 0 refills | Status: DC
Start: 2023-05-18 — End: 2023-06-07

## 2023-06-07 ENCOUNTER — Telehealth (HOSPITAL_BASED_OUTPATIENT_CLINIC_OR_DEPARTMENT_OTHER): Payer: 59 | Admitting: Psychiatry

## 2023-06-07 ENCOUNTER — Encounter (HOSPITAL_COMMUNITY): Payer: Self-pay | Admitting: Psychiatry

## 2023-06-07 VITALS — Wt 135.0 lb

## 2023-06-07 DIAGNOSIS — F419 Anxiety disorder, unspecified: Secondary | ICD-10-CM | POA: Diagnosis not present

## 2023-06-07 DIAGNOSIS — F330 Major depressive disorder, recurrent, mild: Secondary | ICD-10-CM | POA: Diagnosis not present

## 2023-06-07 MED ORDER — ALPRAZOLAM 1 MG PO TABS
1.0000 mg | ORAL_TABLET | Freq: Two times a day (BID) | ORAL | 2 refills | Status: DC
Start: 2023-06-07 — End: 2023-09-06

## 2023-06-07 MED ORDER — BUPROPION HCL ER (XL) 150 MG PO TB24
150.0000 mg | ORAL_TABLET | Freq: Every day | ORAL | 2 refills | Status: DC
Start: 2023-06-07 — End: 2023-09-06

## 2023-06-07 NOTE — Progress Notes (Signed)
[facility] MD Virtual Progress Note   Patient Location: Home Provider Location: Home Office  I connect with patient by video and verified that I am speaking with correct person by using two identifiers. I discussed the limitations of evaluation and management by telemedicine and the availability of in person appointments. I also discussed with the patient that there may be a patient responsible charge related to this service. The patient expressed understanding and agreed to proceed.  Jackie Goodman [id number] 67 y.o.  06/07/2023 10:15 AM  History of Present Illness:  Patient is evaluated by video session.  She reported further deterioration of her kidney function and now she is going to be on a transplant list.  Her 47 year old son is willing to give his kidney and next week she will start the process.  She admitted there are days when she feels tired but overall she is in a good spirits.  She denies any crying spells or any feeling of hopelessness or worthlessness.  She had a good support from her family.  She had restricted her diet due to high potassium and sometime it is difficult but so far manageable.  She is not taking lisinopril and Jardiance.  Now taking amlodipine.  Her last creatinine was 2.6.  Her potassium is 5.2.  She recently had CT scan issues cyst in her left kidney.  She reported chronic stress from the family but so far manageable.  Her niece diagnosed with breast cancer and had bilateral mastectomy.  She denies any hallucination, paranoia or any suicidal thoughts.  She is taking Wellbutrin XL 150 in the morning and Xanax 1 mg twice a day.  Past Psychiatric History: H/O depression since lupus in 1993.  Saw psychiatrist at [facility] but not happy with the care.  Seeing this Clinical research associate since April 2009.  Tried Zoloft in the past.  No h/o inpatient treatment, paranoia or any suicidal attempt.    Outpatient Encounter Medications as of 06/07/2023  Medication Sig   ALPRAZolam  (XANAX) 1 MG tablet Take 1 tablet (1 mg total) by mouth 2 (two) times daily. Bridge to patients appointment 10/7   buPROPion (WELLBUTRIN XL) 150 MG 24 hr tablet Take 1 tablet (150 mg total) by mouth daily. Bridge to patient appointment 10/7   clobetasol (TEMOVATE) 0.05 % external solution Apply 1 application topically 2 (two) times daily.    fluticasone (FLONASE) 50 MCG/ACT nasal spray Place 1 spray into the nose daily.    hydroxychloroquine (PLAQUENIL) 200 MG tablet Take 200 mg by mouth 2 (two) times daily.    JARDIANCE 10 MG TABS tablet Take 10 mg by mouth daily.   lisinopril (ZESTRIL) 10 MG tablet Take 10 mg by mouth daily.   mycophenolate (MYFORTIC) 360 MG TBEC EC tablet Take 360 mg by mouth 2 (two) times daily.   omeprazole (PRILOSEC) 20 MG capsule Take 20 mg by mouth 2 (two) times daily before a meal.   sodium zirconium cyclosilicate (LOKELMA) 10 g PACK packet Take 10 g by mouth 3 (three) times daily.   No facility-administered encounter medications on file as of 06/07/2023.    No results found for this or any previous visit (from the past 2160 hour(s)).   Psychiatric Specialty Exam: Physical Exam  Review of Systems  Weight 135 lb (61.2 kg).There is no height or weight on file to calculate BMI.  General Appearance: Casual  Eye Contact:  Fair  Speech:  Slow  Volume:  Decreased  Mood:  Anxious  Affect:  Congruent  Thought Process:  Descriptions of Associations: Intact  Orientation:  Full (Time, Place, and Person)  Thought Content:  Rumination  Suicidal Thoughts:  No  Homicidal Thoughts:  No  Memory:  Immediate;   Good Recent;   Good Remote;   Good  Judgement:  Intact  Insight:  Present  Psychomotor Activity:  Decreased  Concentration:  Concentration: Fair and Attention Span: Fair  Recall:  Good  Fund of Knowledge:  Good  Language:  Good  Akathisia:  No  Handed:  Right  AIMS (if indicated):     Assets:  Communication Skills Desire for Improvement Housing Social  Support Transportation  ADL's:  Intact  Cognition:  WNL  Sleep:  fair     Assessment/Plan: Mild episode of recurrent major depressive disorder (HCC) - Plan: ALPRAZolam (XANAX) 1 MG tablet, buPROPion (WELLBUTRIN XL) 150 MG 24 hr tablet  Anxiety - Plan: ALPRAZolam (XANAX) 1 MG tablet  Patient is stable on current medication.  She is in the process of getting on transplant list.  Her son will donate the kidney and next week stop the process.  Patient does not want to change the medication since keeping her stable.  Continue Xanax 1 mg twice a day, Wellbutrin XL 150 mg in the morning.  Recommended to call us back if she is any question or any concern.  Follow-up in 3 months   Follow Up Instructions:     I discussed the assessment and treatment plan with the patient. The patient was provided an opportunity to ask questions and all were answered. The patient agreed with the plan and demonstrated an understanding of the instructions.   The patient was advised to call back or seek an in-person evaluation if the symptoms worsen or if the condition fails to improve as anticipated.    Collaboration of Care: Other provider involved in patient's care AEB notes are available in epic to review  Patient/Guardian was advised Release of Information must be obtained prior to any record release in order to collaborate their care with an outside provider. Patient/Guardian was advised if they have not already done so to contact the registration department to sign all necessary forms in order for [place] to release information regarding their care.   Consent: Patient/Guardian gives verbal consent for treatment and assignment of benefits for services provided during this visit. Patient/Guardian expressed understanding and agreed to proceed.     I provided 19 minutes of non face to face time during this encounter.  Note: This document was prepared by [employer] voice dictation technology and any errors that results  from this process are unintentional.    Cleotis Nipper, MD 06/07/2023

## 2023-06-08 DIAGNOSIS — N184 Chronic kidney disease, stage 4 (severe): Secondary | ICD-10-CM | POA: Diagnosis not present

## 2023-06-08 DIAGNOSIS — R0602 Shortness of breath: Secondary | ICD-10-CM | POA: Diagnosis not present

## 2023-06-08 DIAGNOSIS — Z23 Encounter for immunization: Secondary | ICD-10-CM | POA: Diagnosis not present

## 2023-06-08 DIAGNOSIS — I129 Hypertensive chronic kidney disease with stage 1 through stage 4 chronic kidney disease, or unspecified chronic kidney disease: Secondary | ICD-10-CM | POA: Diagnosis not present

## 2023-06-10 ENCOUNTER — Emergency Department (HOSPITAL_BASED_OUTPATIENT_CLINIC_OR_DEPARTMENT_OTHER): Payer: 59 | Admitting: Radiology

## 2023-06-10 ENCOUNTER — Encounter (HOSPITAL_BASED_OUTPATIENT_CLINIC_OR_DEPARTMENT_OTHER): Payer: Self-pay

## 2023-06-10 ENCOUNTER — Emergency Department (HOSPITAL_BASED_OUTPATIENT_CLINIC_OR_DEPARTMENT_OTHER)
Admission: EM | Admit: 2023-06-10 | Discharge: 2023-06-10 | Disposition: A | Discharge status: Home / Self Care | Payer: 59 | Attending: Emergency Medicine | Admitting: Emergency Medicine

## 2023-06-10 ENCOUNTER — Other Ambulatory Visit: Payer: Self-pay

## 2023-06-10 ENCOUNTER — Emergency Department (HOSPITAL_BASED_OUTPATIENT_CLINIC_OR_DEPARTMENT_OTHER): Payer: 59

## 2023-06-10 DIAGNOSIS — W010XXA Fall on same level from slipping, tripping and stumbling without subsequent striking against object, initial encounter: Secondary | ICD-10-CM | POA: Insufficient documentation

## 2023-06-10 DIAGNOSIS — M151 Heberden's nodes (with arthropathy): Secondary | ICD-10-CM | POA: Diagnosis not present

## 2023-06-10 DIAGNOSIS — M19011 Primary osteoarthritis, right shoulder: Secondary | ICD-10-CM | POA: Diagnosis not present

## 2023-06-10 DIAGNOSIS — I10 Essential (primary) hypertension: Secondary | ICD-10-CM | POA: Insufficient documentation

## 2023-06-10 DIAGNOSIS — M25511 Pain in right shoulder: Secondary | ICD-10-CM | POA: Insufficient documentation

## 2023-06-10 DIAGNOSIS — Z720 Tobacco use: Secondary | ICD-10-CM | POA: Diagnosis not present

## 2023-06-10 DIAGNOSIS — M542 Cervicalgia: Secondary | ICD-10-CM | POA: Insufficient documentation

## 2023-06-10 DIAGNOSIS — Z79899 Other long term (current) drug therapy: Secondary | ICD-10-CM | POA: Diagnosis not present

## 2023-06-10 DIAGNOSIS — M4802 Spinal stenosis, cervical region: Secondary | ICD-10-CM | POA: Diagnosis not present

## 2023-06-10 DIAGNOSIS — M79601 Pain in right arm: Secondary | ICD-10-CM | POA: Insufficient documentation

## 2023-06-10 DIAGNOSIS — W19XXXA Unspecified fall, initial encounter: Secondary | ICD-10-CM

## 2023-06-10 DIAGNOSIS — S6981XA Other specified injuries of right wrist, hand and finger(s), initial encounter: Secondary | ICD-10-CM | POA: Diagnosis not present

## 2023-06-10 DIAGNOSIS — S199XXA Unspecified injury of neck, initial encounter: Secondary | ICD-10-CM | POA: Diagnosis not present

## 2023-06-10 DIAGNOSIS — M25521 Pain in right elbow: Secondary | ICD-10-CM | POA: Diagnosis not present

## 2023-06-10 DIAGNOSIS — M25551 Pain in right hip: Secondary | ICD-10-CM | POA: Diagnosis not present

## 2023-06-10 DIAGNOSIS — M79641 Pain in right hand: Secondary | ICD-10-CM | POA: Diagnosis not present

## 2023-06-10 DIAGNOSIS — E049 Nontoxic goiter, unspecified: Secondary | ICD-10-CM | POA: Diagnosis not present

## 2023-06-10 MED ORDER — ACETAMINOPHEN 325 MG PO TABS
650.0000 mg | ORAL_TABLET | Freq: Once | ORAL | Status: AC
  Administered 2023-06-10: 650 mg via ORAL
  Filled 2023-06-10: qty 2

## 2023-06-10 NOTE — ED Triage Notes (Signed)
Larey Seat yesterday tripped on sidewalk.  Fell onto right outstretch arm. Pain from right hand up arm to shoulder.  Able to move with pain.  Denies hitting head or LOC.

## 2023-06-10 NOTE — Discharge Instructions (Signed)
As discussed, imaging studies were negative for any fracture or dislocation.  Will recommend use of rest, ice, elevation, Tylenol for treatment of your pain at home.  Recommend follow-up with primary care for reassessment of your symptoms.  Please do not hesitate to return to emergency department if there are worrisome signs and symptoms we discussed to become apparent.

## 2023-06-10 NOTE — ED Notes (Signed)
Patient transported to X-ray 

## 2023-06-10 NOTE — ED Notes (Signed)
PA at bedside.

## 2023-06-10 NOTE — ED Notes (Signed)
Patient transported to CT 

## 2023-06-10 NOTE — ED Provider Notes (Addendum)
[place] EMERGENCY DEPARTMENT AT [facility] Provider Note   CSN: [id number] Arrival date & time: 06/10/23  1407     History  Chief Complaint  Patient presents with   Marletta Lor    Jackie Goodman is a 67 y.o. female.   Fall   67 year old female presents emergency department with complaints of fall.  Patient states that she was getting out of her car at [employer] yesterday when she tripped on uneven concrete catching her fall with her right arm.  States that since then, has had right-sided arm pain as well as neck pain.  Denies trauma to head, loss conscious, blood thinner use.  Denies any chest pain, shortness of breath, abdominal pain, nausea, vomiting.  States she has been able to walk independently without difficulty.  Has taken no medication for symptoms.  Past medical history significant for lupus, hypertension, renal insufficiency, chronic pain, psychogenic cephalalgia, polymyositis  Home Medications Prior to Admission medications   Medication Sig Start Date End Date Taking? Authorizing Provider  ALPRAZolam Prudy Feeler) 1 MG tablet Take 1 tablet (1 mg total) by mouth 2 (two) times daily. 06/07/23   Arfeen, Phillips Grout, MD  amLODipine (NORVASC) 5 MG tablet Take 5 mg by mouth daily. 04/29/23   [provider]  buPROPion (WELLBUTRIN XL) 150 MG 24 hr tablet Take 1 tablet (150 mg total) by mouth daily. Bridge to patient appointment 10/7 06/07/23   Arfeen, Phillips Grout, MD  clobetasol (TEMOVATE) 0.05 % external solution Apply 1 application topically 2 (two) times daily.  05/09/12   [provider]  fluticasone (FLONASE) 50 MCG/ACT nasal spray Place 1 spray into the nose daily.  06/21/12   [provider]  hydroxychloroquine (PLAQUENIL) 200 MG tablet Take 200 mg by mouth 2 (two) times daily.     [provider]  mycophenolate (MYFORTIC) 360 MG TBEC EC tablet Take 360 mg by mouth 2 (two) times daily.    Charmaine Downs, MD  omeprazole (PRILOSEC) 20 MG  capsule Take 20 mg by mouth 2 (two) times daily before a meal.    [provider]  sodium bicarbonate 325 MG tablet Take 650 mg by mouth 2 (two) times daily.    [provider]  sodium zirconium cyclosilicate (LOKELMA) 10 g PACK packet Take 10 g by mouth 3 (three) times daily. 11/23/22   Charmaine Downs, MD  SUMAtriptan (IMITREX) 5 MG/ACT nasal spray Place 1 spray into the nose every 2 (two) hours as needed for migraine. 11/27/22   [provider]      Allergies    Amoxicillin, Demeclocycline, Sulfa antibiotics, Tetracycline, Clarithromycin, Hydrocodone-acetaminophen, Tetracyclines & related, and Vicodin [hydrocodone-acetaminophen]    Review of Systems   Review of Systems  All other systems reviewed and are negative.   Physical Exam Updated Vital Signs BP (!) 149/84   Pulse 76   Temp 98 F (36.7 C)   Resp 16   Ht 5\' 4"  (1.626 m)   Wt 60.8 kg   SpO2 100%   BMI 23.00 kg/m  Physical Exam Vitals and nursing note reviewed.  Constitutional:      General: She is not in acute distress.    Appearance: She is well-developed.  HENT:     Head: Normocephalic and atraumatic.  Eyes:     Conjunctiva/sclera: Conjunctivae normal.  Cardiovascular:     Rate and Rhythm: Normal rate and regular rhythm.  Pulmonary:     Effort: Pulmonary effort is normal. No respiratory distress.  Breath sounds: Normal breath sounds. No wheezing, rhonchi or rales.  Chest:     Chest wall: No tenderness.  Abdominal:     Palpations: Abdomen is soft.     Tenderness: There is no abdominal tenderness. There is no guarding.  Musculoskeletal:        General: No swelling.     Cervical back: Neck supple.     Comments: Slight midline tenderness of lower cervical spine midline without tenderness midline of thoracic or lumbar spine without step-off or deformity.  Tender palpation of right proximal humerus as well as right distal clavicle.  Tender palpation of medial and lateral epicondyle of  right elbow as well as anteriorly carpal bones of right hand.  Radial and pedal pulses 2+ bilaterally.  No anatomical snuffbox tenderness or pain with axial loading of thumb.  Some ecchymosis appreciated at proximal palm of right hand.  No sensory deficits along major nerve distributions of upper extremities.  Muscular strength 5 out of 5 for bilateral grip, elbow flexion/extension.  Pain associated with range of motion of right shoulder.  Full range of motion of bilateral lower extremities without overlying tenderness.  Skin:    General: Skin is warm and dry.     Capillary Refill: Capillary refill takes less than 2 seconds.  Neurological:     Mental Status: She is alert.  Psychiatric:        Mood and Affect: Mood normal.     ED Results / Procedures / Treatments   Labs (all labs ordered are listed, but only abnormal results are displayed) Labs Reviewed - No data to display  EKG None  Radiology CT Cervical Spine Wo Contrast  Result Date: 06/10/2023 CLINICAL DATA:  Neck injury, fall EXAM: CT CERVICAL SPINE WITHOUT CONTRAST TECHNIQUE: Multidetector CT imaging of the cervical spine was performed without intravenous contrast. Multiplanar CT image reconstructions were also generated. RADIATION DOSE REDUCTION: This exam was performed according to the departmental dose-optimization program which includes automated exposure control, adjustment of the mA and/or kV according to patient size and/or use of iterative reconstruction technique. COMPARISON:  None Available. FINDINGS: Alignment: Normal. Skull base and vertebrae: Craniocervical alignment is normal. The atlantodental interval is not widened. No acute fracture of the cervical spine. Vertebral body height is preserved. Soft tissues and spinal canal: No prevertebral fluid or swelling. No visible canal hematoma. Disc levels: There is intervertebral disc space narrowing and endplate remodeling throughout the cervical spine in keeping with changes of  advanced degenerative disc disease. Prevertebral soft tissues are not thickened on sagittal reformats. Spinal canal is widely patent. No high-grade neuroforaminal narrowing. Upper chest: Negative. Other: Thyroid goiter noted, not well assessed on this examination. IMPRESSION: 1. No acute fracture or listhesis of the cervical spine. 2. Thyroid goiter, not well assessed on this examination. If indicated, this could be better assessed with dedicated sonography. Electronically Signed   By: Helyn Numbers M.D.   On: 06/10/2023 17:56   DG Hand Complete Right  Result Date: 06/10/2023 CLINICAL DATA:  Fall yesterday onto right outstretched arm. Pain from right shoulder through right hand. EXAM: RIGHT HAND - COMPLETE 3+ VIEW COMPARISON:  None Available. FINDINGS: Minimal second through fifth DIP joint space narrowing. Neutral ulnar variance. No acute fracture or dislocation. Smooth benign-appearing mild thickening of the distal medial aspect of the middle phalanx of the fifth finger, possibly the sequela of remote trauma. IMPRESSION: 1. No acute fracture. 2. Minimal second through fifth DIP joint osteoarthritis. Electronically Signed   By: Windy Fast  Viola M.D.   On: 06/10/2023 17:37   DG Elbow Complete Right  Result Date: 06/10/2023 CLINICAL DATA:  Fall yesterday onto right outstretched arm. Pain from right shoulder through right hand. EXAM: RIGHT ELBOW - COMPLETE 3+ VIEW COMPARISON:  None Available. FINDINGS: Normal position of the distal anterior humeral fat pad without evidence of elbow joint effusion. Minimal degenerative spurring at the triceps insertion on the olecranon. Minimal peripheral medial elbow degenerative spurring. No acute fracture or dislocation. IMPRESSION: 1. No acute fracture or dislocation. 2. Minimal degenerative spurring. Electronically Signed   By: Neita Garnet M.D.   On: 06/10/2023 17:36   DG Shoulder Right  Result Date: 06/10/2023 CLINICAL DATA:  Fall yesterday onto right outstretched  arm. Pain from right shoulder through right hand. EXAM: RIGHT SHOULDER - 2+ VIEW COMPARISON:  Right shoulder radiographs 12/11/2022 FINDINGS: There is diffuse decreased bone mineralization. There is again mixed sclerosis and lucency at the greater tuberosity, chronic. Mild acromioclavicular joint space narrowing and peripheral osteophytosis. Mild distal lateral subacromial spurring. No acute fracture is seen. No dislocation. The visualized portion of the right lung is unremarkable. IMPRESSION: 1. No acute fracture or dislocation. 2. Mild acromioclavicular osteoarthritis. Electronically Signed   By: Neita Garnet M.D.   On: 06/10/2023 17:35    Procedures Procedures    Medications Ordered in ED Medications  acetaminophen (TYLENOL) tablet 650 mg (650 mg Oral Given 06/10/23 1545)    ED Course/ Medical Decision Making/ A&P                                 Medical Decision Making Amount and/or Complexity of Data Reviewed Radiology: ordered.  Risk OTC drugs.   This patient presents to the ED for concern of fall, this involves an extensive number of treatment options, and is a complaint that carries with it a high risk of complications and morbidity.  The differential diagnosis includes CVA, fracture, strain/sprain, dislocation, ligamentous/tendinous injury, neurovascular compromise   Co morbidities that complicate the patient evaluation  See HPI   Additional history obtained:  Additional history obtained from EMR External records from outside source obtained and reviewed including see above   Lab Tests:  N/a   Imaging Studies ordered:  I ordered imaging studies including x-ray right shoulder, elbow, hand, CT C-spine I independently visualized and interpreted imaging which showed  X-ray right shoulder: No acute abnormality.  OA X-ray right elbow: No acute abnormality.  OA X-ray right hand: No acute fracture.  Minimal 2nd through 5th DIP joint OA. CT cervical spine: No acute  fracture or traumatic listhesis of cervical spine I agree with the radiologist interpretation  Cardiac Monitoring: / EKG:  The patient was maintained on a cardiac monitor.  I personally viewed and interpreted the cardiac monitored which showed an underlying rhythm of: Sinus rhythm   Consultations Obtained:  N/a   Problem List / ED Course / Critical interventions / Medication management  Fall I ordered medication including Tylenol   Reevaluation of the patient after these medicines showed that the patient improved I have reviewed the patients home medicines and have made adjustments as needed   Social Determinants of Health:  Chronic cigarette use.  Denies illicit drug use.   Test / Admission - Considered:  Fall Vitals signs significant for hypertension blood pressure 149/84. Otherwise within normal range and stable throughout visit. Imaging studies significant for: See above 67 year old female presents emergency department after mechanical fall  when she tripped on uneven concrete bracing her fall with her right upper extremity.  With complaints of right upper extremity pain as well as some neck pain.  X-rays/CT were negative for any acute fracture or dislocation.  Patient reassured by overall negative workup.  Will recommend rest, ice, elevation, Tylenol given patient's history of renal insufficiency for treatment of pain.  Follow-up with primary care recommended for reassessment.  Treatment plan discussed at length with patient and she acknowledged understand was agreeable to said plan.  Patient overall well-appearing, afebrile in no acute distress. Worrisome signs and symptoms were discussed with the patient, and the patient acknowledged understanding to return to the ED if noticed. Patient was stable upon discharge.          Final Clinical Impression(s) / ED Diagnoses Final diagnoses:  Fall, initial encounter    Rx / DC Orders ED Discharge Orders     None             Peter Garter, [place] 06/10/23 Flossie Buffy    Eber Hong, MD 06/18/23 [phone]

## 2023-06-17 DIAGNOSIS — R9431 Abnormal electrocardiogram [ECG] [EKG]: Secondary | ICD-10-CM | POA: Diagnosis not present

## 2023-06-17 DIAGNOSIS — Z01810 Encounter for preprocedural cardiovascular examination: Secondary | ICD-10-CM | POA: Diagnosis not present

## 2023-06-17 DIAGNOSIS — Z87891 Personal history of nicotine dependence: Secondary | ICD-10-CM | POA: Diagnosis not present

## 2023-06-17 DIAGNOSIS — I120 Hypertensive chronic kidney disease with stage 5 chronic kidney disease or end stage renal disease: Secondary | ICD-10-CM | POA: Diagnosis not present

## 2023-06-17 DIAGNOSIS — Z01818 Encounter for other preprocedural examination: Secondary | ICD-10-CM | POA: Diagnosis not present

## 2023-06-17 DIAGNOSIS — N186 End stage renal disease: Secondary | ICD-10-CM | POA: Diagnosis not present

## 2023-06-17 DIAGNOSIS — I517 Cardiomegaly: Secondary | ICD-10-CM | POA: Diagnosis not present

## 2023-06-17 DIAGNOSIS — E1122 Type 2 diabetes mellitus with diabetic chronic kidney disease: Secondary | ICD-10-CM | POA: Diagnosis not present

## 2023-06-17 DIAGNOSIS — I4510 Unspecified right bundle-branch block: Secondary | ICD-10-CM | POA: Diagnosis not present

## 2023-06-17 DIAGNOSIS — M3214 Glomerular disease in systemic lupus erythematosus: Secondary | ICD-10-CM | POA: Diagnosis not present

## 2023-06-17 DIAGNOSIS — I129 Hypertensive chronic kidney disease with stage 1 through stage 4 chronic kidney disease, or unspecified chronic kidney disease: Secondary | ICD-10-CM | POA: Diagnosis not present

## 2023-06-17 DIAGNOSIS — Z7682 Awaiting organ transplant status: Secondary | ICD-10-CM | POA: Diagnosis not present

## 2023-06-17 DIAGNOSIS — E119 Type 2 diabetes mellitus without complications: Secondary | ICD-10-CM | POA: Diagnosis not present

## 2023-06-17 DIAGNOSIS — Z992 Dependence on renal dialysis: Secondary | ICD-10-CM | POA: Diagnosis not present

## 2023-06-22 DIAGNOSIS — I129 Hypertensive chronic kidney disease with stage 1 through stage 4 chronic kidney disease, or unspecified chronic kidney disease: Secondary | ICD-10-CM | POA: Diagnosis not present

## 2023-06-22 DIAGNOSIS — N184 Chronic kidney disease, stage 4 (severe): Secondary | ICD-10-CM | POA: Diagnosis not present

## 2023-06-22 DIAGNOSIS — G4700 Insomnia, unspecified: Secondary | ICD-10-CM | POA: Diagnosis not present

## 2023-06-22 DIAGNOSIS — M25511 Pain in right shoulder: Secondary | ICD-10-CM | POA: Diagnosis not present

## 2023-06-22 DIAGNOSIS — J302 Other seasonal allergic rhinitis: Secondary | ICD-10-CM | POA: Diagnosis not present

## 2023-06-25 DIAGNOSIS — Z79624 Long term (current) use of inhibitors of nucleotide synthesis: Secondary | ICD-10-CM | POA: Diagnosis not present

## 2023-06-25 DIAGNOSIS — E215 Disorder of parathyroid gland, unspecified: Secondary | ICD-10-CM | POA: Diagnosis not present

## 2023-06-25 DIAGNOSIS — Z79899 Other long term (current) drug therapy: Secondary | ICD-10-CM | POA: Diagnosis not present

## 2023-06-25 DIAGNOSIS — I517 Cardiomegaly: Secondary | ICD-10-CM | POA: Diagnosis not present

## 2023-06-25 DIAGNOSIS — N184 Chronic kidney disease, stage 4 (severe): Secondary | ICD-10-CM | POA: Diagnosis not present

## 2023-06-25 DIAGNOSIS — R809 Proteinuria, unspecified: Secondary | ICD-10-CM | POA: Diagnosis not present

## 2023-06-25 DIAGNOSIS — M3219 Other organ or system involvement in systemic lupus erythematosus: Secondary | ICD-10-CM | POA: Diagnosis not present

## 2023-06-25 DIAGNOSIS — Z87891 Personal history of nicotine dependence: Secondary | ICD-10-CM | POA: Diagnosis not present

## 2023-06-25 DIAGNOSIS — M329 Systemic lupus erythematosus, unspecified: Secondary | ICD-10-CM | POA: Diagnosis not present

## 2023-06-30 DIAGNOSIS — R0602 Shortness of breath: Secondary | ICD-10-CM | POA: Diagnosis not present

## 2023-06-30 DIAGNOSIS — J849 Interstitial pulmonary disease, unspecified: Secondary | ICD-10-CM | POA: Diagnosis not present

## 2023-06-30 DIAGNOSIS — Z79899 Other long term (current) drug therapy: Secondary | ICD-10-CM | POA: Diagnosis not present

## 2023-06-30 DIAGNOSIS — Z23 Encounter for immunization: Secondary | ICD-10-CM | POA: Diagnosis not present

## 2023-06-30 DIAGNOSIS — M3481 Systemic sclerosis with lung involvement: Secondary | ICD-10-CM | POA: Diagnosis not present

## 2023-06-30 DIAGNOSIS — Z87891 Personal history of nicotine dependence: Secondary | ICD-10-CM | POA: Diagnosis not present

## 2023-07-13 DIAGNOSIS — R93422 Abnormal radiologic findings on diagnostic imaging of left kidney: Secondary | ICD-10-CM | POA: Diagnosis not present

## 2023-07-13 DIAGNOSIS — R918 Other nonspecific abnormal finding of lung field: Secondary | ICD-10-CM | POA: Diagnosis not present

## 2023-07-13 DIAGNOSIS — D1771 Benign lipomatous neoplasm of kidney: Secondary | ICD-10-CM | POA: Diagnosis not present

## 2023-07-13 DIAGNOSIS — Z7682 Awaiting organ transplant status: Secondary | ICD-10-CM | POA: Diagnosis not present

## 2023-07-13 DIAGNOSIS — E559 Vitamin D deficiency, unspecified: Secondary | ICD-10-CM | POA: Diagnosis not present

## 2023-07-13 DIAGNOSIS — I10 Essential (primary) hypertension: Secondary | ICD-10-CM | POA: Diagnosis not present

## 2023-07-13 DIAGNOSIS — M779 Enthesopathy, unspecified: Secondary | ICD-10-CM | POA: Diagnosis not present

## 2023-07-13 DIAGNOSIS — R935 Abnormal findings on diagnostic imaging of other abdominal regions, including retroperitoneum: Secondary | ICD-10-CM | POA: Diagnosis not present

## 2023-07-13 DIAGNOSIS — Z01810 Encounter for preprocedural cardiovascular examination: Secondary | ICD-10-CM | POA: Diagnosis not present

## 2023-07-13 DIAGNOSIS — D631 Anemia in chronic kidney disease: Secondary | ICD-10-CM | POA: Diagnosis not present

## 2023-07-13 DIAGNOSIS — G43809 Other migraine, not intractable, without status migrainosus: Secondary | ICD-10-CM | POA: Diagnosis not present

## 2023-07-13 DIAGNOSIS — Z01818 Encounter for other preprocedural examination: Secondary | ICD-10-CM | POA: Diagnosis not present

## 2023-07-13 DIAGNOSIS — N184 Chronic kidney disease, stage 4 (severe): Secondary | ICD-10-CM | POA: Diagnosis not present

## 2023-07-13 DIAGNOSIS — M3320 Polymyositis, organ involvement unspecified: Secondary | ICD-10-CM | POA: Diagnosis not present

## 2023-07-13 DIAGNOSIS — H25013 Cortical age-related cataract, bilateral: Secondary | ICD-10-CM | POA: Diagnosis not present

## 2023-07-13 DIAGNOSIS — J849 Interstitial pulmonary disease, unspecified: Secondary | ICD-10-CM | POA: Diagnosis not present

## 2023-07-13 DIAGNOSIS — E875 Hyperkalemia: Secondary | ICD-10-CM | POA: Diagnosis not present

## 2023-07-13 DIAGNOSIS — N069 Isolated proteinuria with unspecified morphologic lesion: Secondary | ICD-10-CM | POA: Diagnosis not present

## 2023-07-14 DIAGNOSIS — Z0000 Encounter for general adult medical examination without abnormal findings: Secondary | ICD-10-CM | POA: Diagnosis not present

## 2023-07-22 DIAGNOSIS — Z1231 Encounter for screening mammogram for malignant neoplasm of breast: Secondary | ICD-10-CM | POA: Diagnosis not present

## 2023-08-02 DIAGNOSIS — N281 Cyst of kidney, acquired: Secondary | ICD-10-CM | POA: Diagnosis not present

## 2023-08-02 DIAGNOSIS — Z01818 Encounter for other preprocedural examination: Secondary | ICD-10-CM | POA: Diagnosis not present

## 2023-08-02 DIAGNOSIS — N184 Chronic kidney disease, stage 4 (severe): Secondary | ICD-10-CM | POA: Diagnosis not present

## 2023-08-02 DIAGNOSIS — R935 Abnormal findings on diagnostic imaging of other abdominal regions, including retroperitoneum: Secondary | ICD-10-CM | POA: Diagnosis not present

## 2023-08-04 DIAGNOSIS — M25511 Pain in right shoulder: Secondary | ICD-10-CM | POA: Diagnosis not present

## 2023-08-04 DIAGNOSIS — N184 Chronic kidney disease, stage 4 (severe): Secondary | ICD-10-CM | POA: Diagnosis not present

## 2023-08-04 DIAGNOSIS — I129 Hypertensive chronic kidney disease with stage 1 through stage 4 chronic kidney disease, or unspecified chronic kidney disease: Secondary | ICD-10-CM | POA: Diagnosis not present

## 2023-08-05 DIAGNOSIS — R928 Other abnormal and inconclusive findings on diagnostic imaging of breast: Secondary | ICD-10-CM | POA: Diagnosis not present

## 2023-08-05 DIAGNOSIS — N6489 Other specified disorders of breast: Secondary | ICD-10-CM | POA: Diagnosis not present

## 2023-09-06 ENCOUNTER — Encounter (HOSPITAL_COMMUNITY): Payer: Self-pay | Admitting: Psychiatry

## 2023-09-06 ENCOUNTER — Telehealth (HOSPITAL_COMMUNITY): Payer: 59 | Admitting: Psychiatry

## 2023-09-06 VITALS — Wt 130.0 lb

## 2023-09-06 DIAGNOSIS — F330 Major depressive disorder, recurrent, mild: Secondary | ICD-10-CM | POA: Diagnosis not present

## 2023-09-06 DIAGNOSIS — F419 Anxiety disorder, unspecified: Secondary | ICD-10-CM | POA: Diagnosis not present

## 2023-09-06 MED ORDER — BUPROPION HCL ER (XL) 150 MG PO TB24
150.0000 mg | ORAL_TABLET | Freq: Every day | ORAL | 2 refills | Status: DC
Start: 2023-09-06 — End: 2023-12-06

## 2023-09-06 MED ORDER — ALPRAZOLAM 1 MG PO TABS
1.0000 mg | ORAL_TABLET | Freq: Two times a day (BID) | ORAL | 2 refills | Status: DC
Start: 2023-09-06 — End: 2023-12-06

## 2023-09-06 NOTE — Progress Notes (Signed)
  Health MD Virtual Progress Note   Patient Location: Home Provider Location: Home Office  I connect with patient by video and verified that I am speaking with correct person by using two identifiers. I discussed the limitations of evaluation and management by telemedicine and the availability of in person appointments. I also discussed with the patient that there may be a patient responsible charge related to this service. The patient expressed understanding and agreed to proceed.  Jackie Goodman [id number] 68 y.o.  09/06/2023 10:29 AM  History of Present Illness:  Patient is evaluated by video session.  She had a good Christmas.  She reported things are going well.  She denies any panic attack, crying spells or any feeling of hopelessness or worthlessness.  She had a visit to the emergency room after she had a trip and fall but now she is doing better.  She is on a renal transplant list but not active as has not started dialysis yet.  Her last creatinine was 2.4 and BUN 22.  She is going to have a parathyroid  surgery very soon.  She reported her physical health is stable and she continues to drive.  She denies any depressive thoughts.  She is taking Wellbutrin  and Xanax  that is keeping her mood stable.  She has no tremors, shakes or any EPS.  She is very well aware about her physical health and tried to keep the appointments with doctors.  Her appetite is okay.  Her weight is unchanged from the past.  Past Psychiatric History: H/O depression since lupus in 1993.  Saw psychiatrist at [facility] but not happy with the care.  Seeing this clinical research associate since April 2009.  Tried Zoloft  in the past.  No h/o inpatient treatment, paranoia or any suicidal attempt.    Outpatient Encounter Medications as of 09/06/2023  Medication Sig   ALPRAZolam  (XANAX ) 1 MG tablet Take 1 tablet (1 mg total) by mouth 2 (two) times daily.   amLODipine (NORVASC) 5 MG tablet Take 5 mg by mouth daily.   buPROPion   (WELLBUTRIN  XL) 150 MG 24 hr tablet Take 1 tablet (150 mg total) by mouth daily. Bridge to patient appointment 10/7   clobetasol (TEMOVATE) 0.05 % external solution Apply 1 application topically 2 (two) times daily.    fluticasone (FLONASE) 50 MCG/ACT nasal spray Place 1 spray into the nose daily.    hydroxychloroquine  (PLAQUENIL ) 200 MG tablet Take 200 mg by mouth 2 (two) times daily.    mycophenolate  (MYFORTIC) 360 MG TBEC EC tablet Take 360 mg by mouth 2 (two) times daily.   omeprazole (PRILOSEC) 20 MG capsule Take 20 mg by mouth 2 (two) times daily before a meal.   sodium bicarbonate 325 MG tablet Take 650 mg by mouth 2 (two) times daily.   sodium zirconium cyclosilicate (LOKELMA) 10 g PACK packet Take 10 g by mouth 3 (three) times daily.   SUMAtriptan (IMITREX) 5 MG/ACT nasal spray Place 1 spray into the nose every 2 (two) hours as needed for migraine.   No facility-administered encounter medications on file as of 09/06/2023.    No results found for this or any previous visit (from the past 2160 hours).   Psychiatric Specialty Exam: Physical Exam  Review of Systems  Weight 130 lb (59 kg).There is no height or weight on file to calculate BMI.  General Appearance: Casual  Eye Contact:  Good  Speech:  Slow  Volume:  Normal  Mood:  Euthymic  Affect:  Appropriate  Thought Process:  Goal Directed  Orientation:  Full (Time, Place, and Person)  Thought Content:  WDL  Suicidal Thoughts:  No  Homicidal Thoughts:  No  Memory:  Immediate;   Good Recent;   Good Remote;   Good  Judgement:  Good  Insight:  Present  Psychomotor Activity:  Normal  Concentration:  Concentration: Good and Attention Span: Good  Recall:  Good  Fund of Knowledge:  Good  Language:  Good  Akathisia:  No  Handed:  Right  AIMS (if indicated):     Assets:  Communication Skills Desire for Improvement Housing Social Support Transportation  ADL's:  Intact  Cognition:  WNL  Sleep:  sleeping late but getting 7  years     Assessment/Plan: Mild episode of recurrent major depressive disorder (HCC) - Plan: ALPRAZolam  (XANAX ) 1 MG tablet, buPROPion  (WELLBUTRIN  XL) 150 MG 24 hr tablet  Anxiety - Plan: ALPRAZolam  (XANAX ) 1 MG tablet  Patient is stable on medication.  She is on transplant list but not active because not on dialysis and creatinine is stable.  Her son will donate the kidney if needed.  He reviewed blood work results.  Continue Xanax  1 mg twice a day and Wellbutrin  XL 150 mg in the morning.  We will do random urine drug test as patient taking benzodiazepine.  Discussed benzodiazepine dependency, tolerance, withdrawal.  Recommend to call us  back if she is any question or any concern.  Follow-up in 3 months   Follow Up Instructions:     I discussed the assessment and treatment plan with the patient. The patient was provided an opportunity to ask questions and all were answered. The patient agreed with the plan and demonstrated an understanding of the instructions.   The patient was advised to call back or seek an in-person evaluation if the symptoms worsen or if the condition fails to improve as anticipated.    Collaboration of Care: Other provider involved in patient's care AEB notes are available in epic to review  Patient/Guardian was advised Release of Information must be obtained prior to any record release in order to collaborate their care with an outside provider. Patient/Guardian was advised if they have not already done so to contact the registration department to sign all necessary forms in order for us  to release information regarding their care.   Consent: Patient/Guardian gives verbal consent for treatment and assignment of benefits for services provided during this visit. Patient/Guardian expressed understanding and agreed to proceed.     I provided 21 minutes of non face to face time during this encounter.  Note: This document was prepared by [employer] voice dictation  technology and any errors that results from this process are unintentional.    Leni ONEIDA Client, MD 09/06/2023

## 2023-09-07 ENCOUNTER — Ambulatory Visit (HOSPITAL_COMMUNITY): Payer: 59

## 2023-09-07 NOTE — Progress Notes (Signed)
 Patient came today for a UDS, patient was negative for all but Benzos, patient takes Xanax.

## 2023-09-13 DIAGNOSIS — E041 Nontoxic single thyroid nodule: Secondary | ICD-10-CM | POA: Diagnosis not present

## 2023-09-28 DIAGNOSIS — M329 Systemic lupus erythematosus, unspecified: Secondary | ICD-10-CM | POA: Diagnosis not present

## 2023-09-28 DIAGNOSIS — M25511 Pain in right shoulder: Secondary | ICD-10-CM | POA: Diagnosis not present

## 2023-09-28 DIAGNOSIS — I129 Hypertensive chronic kidney disease with stage 1 through stage 4 chronic kidney disease, or unspecified chronic kidney disease: Secondary | ICD-10-CM | POA: Diagnosis not present

## 2023-09-28 DIAGNOSIS — N184 Chronic kidney disease, stage 4 (severe): Secondary | ICD-10-CM | POA: Diagnosis not present

## 2023-09-29 DIAGNOSIS — H25813 Combined forms of age-related cataract, bilateral: Secondary | ICD-10-CM | POA: Diagnosis not present

## 2023-10-14 DIAGNOSIS — E049 Nontoxic goiter, unspecified: Secondary | ICD-10-CM | POA: Diagnosis not present

## 2023-10-14 DIAGNOSIS — M12811 Other specific arthropathies, not elsewhere classified, right shoulder: Secondary | ICD-10-CM | POA: Diagnosis not present

## 2023-10-14 DIAGNOSIS — M25519 Pain in unspecified shoulder: Secondary | ICD-10-CM | POA: Diagnosis not present

## 2023-10-14 DIAGNOSIS — D631 Anemia in chronic kidney disease: Secondary | ICD-10-CM | POA: Diagnosis not present

## 2023-10-14 DIAGNOSIS — N184 Chronic kidney disease, stage 4 (severe): Secondary | ICD-10-CM | POA: Diagnosis not present

## 2023-10-14 DIAGNOSIS — I10 Essential (primary) hypertension: Secondary | ICD-10-CM | POA: Diagnosis not present

## 2023-10-14 DIAGNOSIS — E875 Hyperkalemia: Secondary | ICD-10-CM | POA: Diagnosis not present

## 2023-10-14 DIAGNOSIS — I129 Hypertensive chronic kidney disease with stage 1 through stage 4 chronic kidney disease, or unspecified chronic kidney disease: Secondary | ICD-10-CM | POA: Diagnosis not present

## 2023-10-14 DIAGNOSIS — Z23 Encounter for immunization: Secondary | ICD-10-CM | POA: Diagnosis not present

## 2023-10-19 DIAGNOSIS — R945 Abnormal results of liver function studies: Secondary | ICD-10-CM | POA: Diagnosis not present

## 2023-10-19 DIAGNOSIS — I10 Essential (primary) hypertension: Secondary | ICD-10-CM | POA: Diagnosis not present

## 2023-10-19 DIAGNOSIS — N185 Chronic kidney disease, stage 5: Secondary | ICD-10-CM | POA: Diagnosis not present

## 2023-10-29 DIAGNOSIS — M3219 Other organ or system involvement in systemic lupus erythematosus: Secondary | ICD-10-CM | POA: Diagnosis not present

## 2023-10-29 DIAGNOSIS — M3320 Polymyositis, organ involvement unspecified: Secondary | ICD-10-CM | POA: Diagnosis not present

## 2023-10-29 DIAGNOSIS — Z79899 Other long term (current) drug therapy: Secondary | ICD-10-CM | POA: Diagnosis not present

## 2023-10-29 DIAGNOSIS — M329 Systemic lupus erythematosus, unspecified: Secondary | ICD-10-CM | POA: Diagnosis not present

## 2023-11-04 DIAGNOSIS — M75111 Incomplete rotator cuff tear or rupture of right shoulder, not specified as traumatic: Secondary | ICD-10-CM | POA: Diagnosis not present

## 2023-11-04 DIAGNOSIS — M7521 Bicipital tendinitis, right shoulder: Secondary | ICD-10-CM | POA: Diagnosis not present

## 2023-12-03 DIAGNOSIS — N189 Chronic kidney disease, unspecified: Secondary | ICD-10-CM | POA: Diagnosis not present

## 2023-12-03 DIAGNOSIS — Z79899 Other long term (current) drug therapy: Secondary | ICD-10-CM | POA: Diagnosis not present

## 2023-12-03 DIAGNOSIS — H269 Unspecified cataract: Secondary | ICD-10-CM | POA: Diagnosis not present

## 2023-12-03 DIAGNOSIS — Z135 Encounter for screening for eye and ear disorders: Secondary | ICD-10-CM | POA: Diagnosis not present

## 2023-12-03 DIAGNOSIS — Z7682 Awaiting organ transplant status: Secondary | ICD-10-CM | POA: Diagnosis not present

## 2023-12-06 ENCOUNTER — Encounter (HOSPITAL_COMMUNITY): Payer: Self-pay | Admitting: Psychiatry

## 2023-12-06 ENCOUNTER — Telehealth (HOSPITAL_BASED_OUTPATIENT_CLINIC_OR_DEPARTMENT_OTHER): Payer: 59 | Admitting: Psychiatry

## 2023-12-06 DIAGNOSIS — F419 Anxiety disorder, unspecified: Secondary | ICD-10-CM

## 2023-12-06 DIAGNOSIS — F330 Major depressive disorder, recurrent, mild: Secondary | ICD-10-CM | POA: Diagnosis not present

## 2023-12-06 MED ORDER — BUPROPION HCL ER (XL) 150 MG PO TB24
150.0000 mg | ORAL_TABLET | Freq: Every day | ORAL | 2 refills | Status: DC
Start: 2023-12-06 — End: 2024-03-06

## 2023-12-06 MED ORDER — ALPRAZOLAM 1 MG PO TABS
1.0000 mg | ORAL_TABLET | Freq: Two times a day (BID) | ORAL | 2 refills | Status: DC
Start: 2023-12-06 — End: 2024-03-06

## 2023-12-06 NOTE — Progress Notes (Signed)
 [facility] MD Virtual Progress Note   Patient Location: Home Provider Location: Home Office  I connect with patient by video and verified that I am speaking with correct person by using two identifiers. I discussed the limitations of evaluation and management by telemedicine and the availability of in person appointments. I also discussed with the patient that there may be a patient responsible charge related to this service. The patient expressed understanding and agreed to proceed.  Jackie Goodman [id number] 68 y.o.  12/06/2023 10:30 AM  History of Present Illness:  Patient is evaluated by video session.  She reported sadness because few weeks ago her 69 year old niece from dead in the home.  Patient told she was not going to work and her employer wants to have a wellness check and police found her dead in the bathtub.  Patient not sure what was the reason but reported niece was sick for a while.  Patient told body was brought to [place] where they have cremated her.  Patient told she had lived with the niece in the past and she was very attached to her aunt feel very sad about losing her.  She told her niece left two adult children.  Patient overall doing okay.  Her GFR increase and her nephrologist cut down the Plaquenil.  She is on transplant list but not active.  She is taking Xanax and Wellbutrin.  She denies any panic attack, crying spells or any feeling of hopelessness or worthlessness.  She has no tremor or shakes or any EPS.  She like to keep the current medication.  Appetite is okay.  Weight is unchanged from the past.  She denies drinking or using any illegal substances.  Past Psychiatric History: H/O depression since lupus in 1993.  Saw psychiatrist at [facility] [place] but not happy with the care.  Seeing this Clinical research associate since April 2009.  Tried Zoloft in the past.  No h/o inpatient treatment, paranoia or any suicidal attempt.    Outpatient Encounter Medications as of 12/06/2023   Medication Sig   ALPRAZolam (XANAX) 1 MG tablet Take 1 tablet (1 mg total) by mouth 2 (two) times daily.   amLODipine (NORVASC) 5 MG tablet Take 5 mg by mouth daily.   buPROPion (WELLBUTRIN XL) 150 MG 24 hr tablet Take 1 tablet (150 mg total) by mouth daily. Bridge to patient appointment 10/7   clobetasol (TEMOVATE) 0.05 % external solution Apply 1 application topically 2 (two) times daily.    fluticasone (FLONASE) 50 MCG/ACT nasal spray Place 1 spray into the nose daily.    hydroxychloroquine (PLAQUENIL) 200 MG tablet Take 200 mg by mouth 2 (two) times daily.    mycophenolate (MYFORTIC) 360 MG TBEC EC tablet Take 360 mg by mouth 2 (two) times daily.   omeprazole (PRILOSEC) 20 MG capsule Take 20 mg by mouth 2 (two) times daily before a meal.   sodium bicarbonate 325 MG tablet Take 650 mg by mouth 2 (two) times daily.   sodium zirconium cyclosilicate (LOKELMA) 10 g PACK packet Take 10 g by mouth 3 (three) times daily.   SUMAtriptan (IMITREX) 5 MG/ACT nasal spray Place 1 spray into the nose every 2 (two) hours as needed for migraine.   No facility-administered encounter medications on file as of 12/06/2023.    No results found for this or any previous visit (from the past 2160 hours).   Psychiatric Specialty Exam: Physical Exam  Review of Systems  Weight 130 lb (59 kg).There is no height or weight on  file to calculate BMI.  General Appearance: Casual  Eye Contact:  Fair  Speech:  Slow  Volume:  Decreased  Mood:  Dysphoric  Affect:  Congruent  Thought Process:  Goal Directed  Orientation:  Full (Time, Place, and Person)  Thought Content:  Logical  Suicidal Thoughts:  No  Homicidal Thoughts:  No  Memory:  Immediate;   Good Recent;   Good Remote;   Good  Judgement:  Good  Insight:  Present  Psychomotor Activity:  Normal  Concentration:  Concentration: Good and Attention Span: Good  Recall:  Good  Fund of Knowledge:  Good  Language:  Good  Akathisia:  No  Handed:  Right  AIMS  (if indicated):     Assets:  Communication Skills Desire for Improvement Housing Social Support Talents/Skills Transportation  ADL's:  Intact  Cognition:  WNL  Sleep:  fair     Assessment/Plan: Mild episode of recurrent major depressive disorder (HCC) - Plan: ALPRAZolam (XANAX) 1 MG tablet, buPROPion (WELLBUTRIN XL) 150 MG 24 hr tablet  Anxiety - Plan: ALPRAZolam (XANAX) 1 MG tablet  Discussed recent loss of her 36 year old niece who was found dead in the bathtub.  Patient admitted went through grief but she has a very good supportive family around.  Overall she feels stable on current medication.  We have done random urine test and it was positive for benzodiazepine as patient was taking Xanax.  No illegal substance use.  Continue Xanax 1 mg 2 times a day and Wellbutrin XL 150 mg in the morning.  Recommend to call us back if you have any question or any concern.  Follow-up in 3 months   Follow Up Instructions:     I discussed the assessment and treatment plan with the patient. The patient was provided an opportunity to ask questions and all were answered. The patient agreed with the plan and demonstrated an understanding of the instructions.   The patient was advised to call back or seek an in-person evaluation if the symptoms worsen or if the condition fails to improve as anticipated.    Collaboration of Care: Other provider involved in patient's care AEB notes are available in epic to review  Patient/Guardian was advised Release of Information must be obtained prior to any record release in order to collaborate their care with an outside provider. Patient/Guardian was advised if they have not already done so to contact the registration department to sign all necessary forms in order for [place] to release information regarding their care.   Consent: Patient/Guardian gives verbal consent for treatment and assignment of benefits for services provided during this visit. Patient/Guardian  expressed understanding and agreed to proceed.     I provided 17 minutes of non face to face time during this encounter.  Note: This document was prepared by [employer] voice dictation technology and any errors that results from this process are unintentional.    Cleotis Nipper, MD 12/06/2023

## 2023-12-22 DIAGNOSIS — I129 Hypertensive chronic kidney disease with stage 1 through stage 4 chronic kidney disease, or unspecified chronic kidney disease: Secondary | ICD-10-CM | POA: Diagnosis not present

## 2023-12-22 DIAGNOSIS — K219 Gastro-esophageal reflux disease without esophagitis: Secondary | ICD-10-CM | POA: Diagnosis not present

## 2023-12-22 DIAGNOSIS — H1089 Other conjunctivitis: Secondary | ICD-10-CM | POA: Diagnosis not present

## 2023-12-22 DIAGNOSIS — J3089 Other allergic rhinitis: Secondary | ICD-10-CM | POA: Diagnosis not present

## 2023-12-22 DIAGNOSIS — N184 Chronic kidney disease, stage 4 (severe): Secondary | ICD-10-CM | POA: Diagnosis not present

## 2023-12-27 DIAGNOSIS — E7800 Pure hypercholesterolemia, unspecified: Secondary | ICD-10-CM | POA: Diagnosis not present

## 2023-12-27 DIAGNOSIS — Z131 Encounter for screening for diabetes mellitus: Secondary | ICD-10-CM | POA: Diagnosis not present

## 2023-12-27 DIAGNOSIS — Z13220 Encounter for screening for lipoid disorders: Secondary | ICD-10-CM | POA: Diagnosis not present

## 2023-12-27 DIAGNOSIS — Z13228 Encounter for screening for other metabolic disorders: Secondary | ICD-10-CM | POA: Diagnosis not present

## 2023-12-29 DIAGNOSIS — Z79899 Other long term (current) drug therapy: Secondary | ICD-10-CM | POA: Diagnosis not present

## 2023-12-29 DIAGNOSIS — Z87891 Personal history of nicotine dependence: Secondary | ICD-10-CM | POA: Diagnosis not present

## 2023-12-29 DIAGNOSIS — J984 Other disorders of lung: Secondary | ICD-10-CM | POA: Diagnosis not present

## 2023-12-29 DIAGNOSIS — L309 Dermatitis, unspecified: Secondary | ICD-10-CM | POA: Diagnosis not present

## 2023-12-29 DIAGNOSIS — J849 Interstitial pulmonary disease, unspecified: Secondary | ICD-10-CM | POA: Diagnosis not present

## 2023-12-29 DIAGNOSIS — R0602 Shortness of breath: Secondary | ICD-10-CM | POA: Diagnosis not present

## 2023-12-29 DIAGNOSIS — M329 Systemic lupus erythematosus, unspecified: Secondary | ICD-10-CM | POA: Diagnosis not present

## 2024-01-20 DIAGNOSIS — L209 Atopic dermatitis, unspecified: Secondary | ICD-10-CM | POA: Diagnosis not present

## 2024-01-20 DIAGNOSIS — J069 Acute upper respiratory infection, unspecified: Secondary | ICD-10-CM | POA: Diagnosis not present

## 2024-02-04 DIAGNOSIS — R928 Other abnormal and inconclusive findings on diagnostic imaging of breast: Secondary | ICD-10-CM | POA: Diagnosis not present

## 2024-02-04 DIAGNOSIS — N6315 Unspecified lump in the right breast, overlapping quadrants: Secondary | ICD-10-CM | POA: Diagnosis not present

## 2024-02-07 ENCOUNTER — Other Ambulatory Visit: Payer: Self-pay | Admitting: Family Medicine

## 2024-02-07 ENCOUNTER — Ambulatory Visit
Admission: RE | Admit: 2024-02-07 | Discharge: 2024-02-07 | Disposition: A | Discharge status: Home / Self Care | Source: Ambulatory Visit | Attending: Family Medicine | Admitting: Family Medicine

## 2024-02-07 DIAGNOSIS — R0602 Shortness of breath: Secondary | ICD-10-CM | POA: Diagnosis not present

## 2024-02-07 DIAGNOSIS — J069 Acute upper respiratory infection, unspecified: Secondary | ICD-10-CM | POA: Diagnosis not present

## 2024-02-07 DIAGNOSIS — M62838 Other muscle spasm: Secondary | ICD-10-CM | POA: Diagnosis not present

## 2024-02-07 DIAGNOSIS — R059 Cough, unspecified: Secondary | ICD-10-CM | POA: Diagnosis not present

## 2024-02-14 DIAGNOSIS — M25561 Pain in right knee: Secondary | ICD-10-CM | POA: Diagnosis not present

## 2024-02-14 DIAGNOSIS — R0602 Shortness of breath: Secondary | ICD-10-CM | POA: Diagnosis not present

## 2024-02-24 DIAGNOSIS — I10 Essential (primary) hypertension: Secondary | ICD-10-CM | POA: Diagnosis not present

## 2024-02-24 DIAGNOSIS — E875 Hyperkalemia: Secondary | ICD-10-CM | POA: Diagnosis not present

## 2024-02-24 DIAGNOSIS — D631 Anemia in chronic kidney disease: Secondary | ICD-10-CM | POA: Diagnosis not present

## 2024-02-24 DIAGNOSIS — N184 Chronic kidney disease, stage 4 (severe): Secondary | ICD-10-CM | POA: Diagnosis not present

## 2024-02-25 DIAGNOSIS — I517 Cardiomegaly: Secondary | ICD-10-CM | POA: Diagnosis not present

## 2024-02-25 DIAGNOSIS — M3219 Other organ or system involvement in systemic lupus erythematosus: Secondary | ICD-10-CM | POA: Diagnosis not present

## 2024-02-25 DIAGNOSIS — M7521 Bicipital tendinitis, right shoulder: Secondary | ICD-10-CM | POA: Diagnosis not present

## 2024-02-25 DIAGNOSIS — E213 Hyperparathyroidism, unspecified: Secondary | ICD-10-CM | POA: Diagnosis not present

## 2024-02-25 DIAGNOSIS — Z79899 Other long term (current) drug therapy: Secondary | ICD-10-CM | POA: Diagnosis not present

## 2024-02-25 DIAGNOSIS — Z87891 Personal history of nicotine dependence: Secondary | ICD-10-CM | POA: Diagnosis not present

## 2024-02-25 DIAGNOSIS — J849 Interstitial pulmonary disease, unspecified: Secondary | ICD-10-CM | POA: Diagnosis not present

## 2024-02-25 DIAGNOSIS — N184 Chronic kidney disease, stage 4 (severe): Secondary | ICD-10-CM | POA: Diagnosis not present

## 2024-02-25 DIAGNOSIS — M3214 Glomerular disease in systemic lupus erythematosus: Secondary | ICD-10-CM | POA: Diagnosis not present

## 2024-02-25 DIAGNOSIS — R252 Cramp and spasm: Secondary | ICD-10-CM | POA: Diagnosis not present

## 2024-02-25 DIAGNOSIS — M329 Systemic lupus erythematosus, unspecified: Secondary | ICD-10-CM | POA: Diagnosis not present

## 2024-02-25 DIAGNOSIS — M25461 Effusion, right knee: Secondary | ICD-10-CM | POA: Diagnosis not present

## 2024-02-25 DIAGNOSIS — R5383 Other fatigue: Secondary | ICD-10-CM | POA: Diagnosis not present

## 2024-02-25 DIAGNOSIS — R809 Proteinuria, unspecified: Secondary | ICD-10-CM | POA: Diagnosis not present

## 2024-03-06 ENCOUNTER — Encounter (HOSPITAL_COMMUNITY): Payer: Self-pay | Admitting: Psychiatry

## 2024-03-06 ENCOUNTER — Telehealth (HOSPITAL_BASED_OUTPATIENT_CLINIC_OR_DEPARTMENT_OTHER): Admitting: Psychiatry

## 2024-03-06 DIAGNOSIS — F330 Major depressive disorder, recurrent, mild: Secondary | ICD-10-CM | POA: Diagnosis not present

## 2024-03-06 DIAGNOSIS — F419 Anxiety disorder, unspecified: Secondary | ICD-10-CM | POA: Diagnosis not present

## 2024-03-06 MED ORDER — ALPRAZOLAM 1 MG PO TABS: 1.0000 mg | ORAL_TABLET | Freq: Two times a day (BID) | ORAL | 2 refills | Status: DC

## 2024-03-06 MED ORDER — BUPROPION HCL ER (XL) 150 MG PO TB24: 150.0000 mg | ORAL_TABLET | Freq: Every day | ORAL | 2 refills | Status: DC

## 2024-03-06 NOTE — Progress Notes (Signed)
 [facility] MD Virtual Progress Note   Patient Location: [facility] Provider Location: Home Office  I connect with patient by video and verified that I am speaking with correct person by using two identifiers. I discussed the limitations of evaluation and management by telemedicine and the availability of in person appointments. I also discussed with the patient that there may be a patient responsible charge related to this service. The patient expressed understanding and agreed to proceed.  Jackie Goodman [id number] 68 y.o.  03/06/2024 10:44 AM  History of Present Illness:  Patient is evaluated by video session.  She is at [facility] with the family and enjoying time.  She reported things are going okay.  Recently she had a blood work at her kidney doctor and found to have BUN 30 and creatinine 2.6.  Numbers are moderately increased and she admitted not watching her diet properly.  She gained few pounds but like to go back to her low sodium and low potassium diet.  She reported having some swelling in her legs but given the steroids injection had helped her a lot.  She reported her anxiety and depression is a stable.  She denies any major panic attack, crying spells or any feeling of hopelessness or worthlessness.  She reported her sister still struggle because sister lost 60 year old daughter after she found dead in the home.  Patient has mention before that her 8 year old niece died due to complications and she was very sick.  Overall she feels things are going very well and does not want to change the medication.  Her sleep is fair but able to get 7 to 8 hours.  She denies any suicidal thoughts or hopelessness.  She has no tremors, shakes or any EPS.  She would like to continue Xanax  1 mg 2 times a day and Wellbutrin  XL 150 mg in the morning.  She denies drinking or using any illegal substances.  Past Psychiatric History: H/O depression since lupus in 1993.  Saw psychiatrist at  [facility] but not happy with the care.  Seeing this Clinical research associate since April 2009.  Tried Zoloft  in the past.  No h/o inpatient treatment, paranoia or any suicidal attempt.    Outpatient Encounter Medications as of 03/06/2024  Medication Sig   ALPRAZolam  (XANAX ) 1 MG tablet Take 1 tablet (1 mg total) by mouth 2 (two) times daily.   amLODipine (NORVASC) 5 MG tablet Take 5 mg by mouth daily.   buPROPion  (WELLBUTRIN  XL) 150 MG 24 hr tablet Take 1 tablet (150 mg total) by mouth daily.   clobetasol (TEMOVATE) 0.05 % external solution Apply 1 application topically 2 (two) times daily.    fluticasone (FLONASE) 50 MCG/ACT nasal spray Place 1 spray into the nose daily.    hydroxychloroquine  (PLAQUENIL ) 200 MG tablet Take 200 mg by mouth 2 (two) times daily.    mycophenolate  (MYFORTIC) 360 MG TBEC EC tablet Take 360 mg by mouth 2 (two) times daily.   omeprazole (PRILOSEC) 20 MG capsule Take 20 mg by mouth 2 (two) times daily before a meal.   sodium bicarbonate 325 MG tablet Take 650 mg by mouth 2 (two) times daily.   sodium zirconium cyclosilicate (LOKELMA) 10 g PACK packet Take 10 g by mouth 3 (three) times daily.   SUMAtriptan (IMITREX) 5 MG/ACT nasal spray Place 1 spray into the nose every 2 (two) hours as needed for migraine.   No facility-administered encounter medications on file as of 03/06/2024.    No results found  for this or any previous visit (from the past 2160 hours).   Psychiatric Specialty Exam: Physical Exam  Review of Systems  Weight 136 lb (61.7 kg).There is no height or weight on file to calculate BMI.  General Appearance: Casual  Eye Contact:  Good  Speech:  Clear and Coherent  Volume:  Normal  Mood:  Euthymic  Affect:  Appropriate  Thought Process:  Goal Directed  Orientation:  Full (Time, Place, and Person)  Thought Content:  WDL  Suicidal Thoughts:  No  Homicidal Thoughts:  No  Memory:  Immediate;   Good Recent;   Good Remote;   Good  Judgement:  Good  Insight:  Good   Psychomotor Activity:  Normal  Concentration:  Concentration: Good and Attention Span: Good  Recall:  Good  Fund of Knowledge:  Good  Language:  Good  Akathisia:  No  Handed:  Right  AIMS (if indicated):     Assets:  Communication Skills Desire for Improvement Housing Social Support Transportation  ADL's:  Intact  Cognition:  WNL  Sleep:  fair       03/06/2024   10:47 AM  Depression screen PHQ 2/9  Decreased Interest 0  Down, Depressed, Hopeless 0  PHQ - 2 Score 0    Assessment/Plan: Mild episode of recurrent major depressive disorder (HCC) - Plan: ALPRAZolam  (XANAX ) 1 MG tablet, buPROPion  (WELLBUTRIN  XL) 150 MG 24 hr tablet  Anxiety - Plan: ALPRAZolam  (XANAX ) 1 MG tablet  Patient is stable on current medication however some not consistent with the diet plan and gained few pounds and last creatinine 2.6 and BUN 30.  She is hoping to have better control on her diet.  Does not want to change the medication since it is working well.  She denies illegal substance use.  Continue Xanax  1 mg 2 times a day and Wellbutrin  XL 150 mg in the morning.  Recommend to call us  back if she is any question or any concern.  Follow-up in 3 months.   Follow Up Instructions:     I discussed the assessment and treatment plan with the patient. The patient was provided an opportunity to ask questions and all were answered. The patient agreed with the plan and demonstrated an understanding of the instructions.   The patient was advised to call back or seek an in-person evaluation if the symptoms worsen or if the condition fails to improve as anticipated.    Collaboration of Care: Other provider involved in patient's care AEB notes are available in epic to review  Patient/Guardian was advised Release of Information must be obtained prior to any record release in order to collaborate their care with an outside provider. Patient/Guardian was advised if they have not already done so to contact the  registration department to sign all necessary forms in order for us  to release information regarding their care.   Consent: Patient/Guardian gives verbal consent for treatment and assignment of benefits for services provided during this visit. Patient/Guardian expressed understanding and agreed to proceed.     Total encounter time 19 minutes which includes face-to-face time, chart reviewed, care coordination, order entry and documentation during this encounter.   Note: This document was prepared by [employer] voice dictation technology and any errors that results from this process are unintentional.    Leni ONEIDA Client, MD 03/06/2024

## 2024-03-16 DIAGNOSIS — T63461A Toxic effect of venom of wasps, accidental (unintentional), initial encounter: Secondary | ICD-10-CM | POA: Diagnosis not present

## 2024-03-16 DIAGNOSIS — M79662 Pain in left lower leg: Secondary | ICD-10-CM | POA: Diagnosis not present

## 2024-03-16 DIAGNOSIS — R21 Rash and other nonspecific skin eruption: Secondary | ICD-10-CM | POA: Diagnosis not present

## 2024-03-20 DIAGNOSIS — Z1231 Encounter for screening mammogram for malignant neoplasm of breast: Secondary | ICD-10-CM | POA: Diagnosis not present

## 2024-03-29 ENCOUNTER — Encounter: Payer: Self-pay | Admitting: Obstetrics and Gynecology

## 2024-03-30 DIAGNOSIS — M329 Systemic lupus erythematosus, unspecified: Secondary | ICD-10-CM | POA: Diagnosis not present

## 2024-03-30 DIAGNOSIS — B0223 Postherpetic polyneuropathy: Secondary | ICD-10-CM | POA: Diagnosis not present

## 2024-04-05 DIAGNOSIS — N6041 Mammary duct ectasia of right breast: Secondary | ICD-10-CM | POA: Diagnosis not present

## 2024-04-05 DIAGNOSIS — R928 Other abnormal and inconclusive findings on diagnostic imaging of breast: Secondary | ICD-10-CM | POA: Diagnosis not present

## 2024-04-05 DIAGNOSIS — R92321 Mammographic fibroglandular density, right breast: Secondary | ICD-10-CM | POA: Diagnosis not present

## 2024-05-16 DIAGNOSIS — N184 Chronic kidney disease, stage 4 (severe): Secondary | ICD-10-CM | POA: Diagnosis not present

## 2024-05-16 DIAGNOSIS — I129 Hypertensive chronic kidney disease with stage 1 through stage 4 chronic kidney disease, or unspecified chronic kidney disease: Secondary | ICD-10-CM | POA: Diagnosis not present

## 2024-05-16 DIAGNOSIS — A09 Infectious gastroenteritis and colitis, unspecified: Secondary | ICD-10-CM | POA: Diagnosis not present

## 2024-05-16 DIAGNOSIS — E782 Mixed hyperlipidemia: Secondary | ICD-10-CM | POA: Diagnosis not present

## 2024-05-16 DIAGNOSIS — G44209 Tension-type headache, unspecified, not intractable: Secondary | ICD-10-CM | POA: Diagnosis not present

## 2024-05-16 DIAGNOSIS — M3210 Systemic lupus erythematosus, organ or system involvement unspecified: Secondary | ICD-10-CM | POA: Diagnosis not present

## 2024-05-16 DIAGNOSIS — N185 Chronic kidney disease, stage 5: Secondary | ICD-10-CM | POA: Diagnosis not present

## 2024-05-16 DIAGNOSIS — G4700 Insomnia, unspecified: Secondary | ICD-10-CM | POA: Diagnosis not present

## 2024-05-16 DIAGNOSIS — D519 Vitamin B12 deficiency anemia, unspecified: Secondary | ICD-10-CM | POA: Diagnosis not present

## 2024-05-16 DIAGNOSIS — B0229 Other postherpetic nervous system involvement: Secondary | ICD-10-CM | POA: Diagnosis not present

## 2024-05-16 DIAGNOSIS — R7303 Prediabetes: Secondary | ICD-10-CM | POA: Diagnosis not present

## 2024-06-02 DIAGNOSIS — R809 Proteinuria, unspecified: Secondary | ICD-10-CM | POA: Diagnosis not present

## 2024-06-02 DIAGNOSIS — E041 Nontoxic single thyroid nodule: Secondary | ICD-10-CM | POA: Diagnosis not present

## 2024-06-02 DIAGNOSIS — R252 Cramp and spasm: Secondary | ICD-10-CM | POA: Diagnosis not present

## 2024-06-02 DIAGNOSIS — Z23 Encounter for immunization: Secondary | ICD-10-CM | POA: Diagnosis not present

## 2024-06-02 DIAGNOSIS — R197 Diarrhea, unspecified: Secondary | ICD-10-CM | POA: Diagnosis not present

## 2024-06-02 DIAGNOSIS — M7521 Bicipital tendinitis, right shoulder: Secondary | ICD-10-CM | POA: Diagnosis not present

## 2024-06-02 DIAGNOSIS — Z87891 Personal history of nicotine dependence: Secondary | ICD-10-CM | POA: Diagnosis not present

## 2024-06-02 DIAGNOSIS — M329 Systemic lupus erythematosus, unspecified: Secondary | ICD-10-CM | POA: Diagnosis not present

## 2024-06-02 DIAGNOSIS — N184 Chronic kidney disease, stage 4 (severe): Secondary | ICD-10-CM | POA: Diagnosis not present

## 2024-06-02 DIAGNOSIS — I1310 Hypertensive heart and chronic kidney disease without heart failure, with stage 1 through stage 4 chronic kidney disease, or unspecified chronic kidney disease: Secondary | ICD-10-CM | POA: Diagnosis not present

## 2024-06-05 DIAGNOSIS — M329 Systemic lupus erythematosus, unspecified: Secondary | ICD-10-CM | POA: Diagnosis not present

## 2024-06-06 ENCOUNTER — Telehealth (HOSPITAL_COMMUNITY): Admitting: Psychiatry

## 2024-06-06 ENCOUNTER — Encounter (HOSPITAL_COMMUNITY): Payer: Self-pay | Admitting: Psychiatry

## 2024-06-06 VITALS — Wt 136.0 lb

## 2024-06-06 DIAGNOSIS — F419 Anxiety disorder, unspecified: Secondary | ICD-10-CM

## 2024-06-06 DIAGNOSIS — F4321 Adjustment disorder with depressed mood: Secondary | ICD-10-CM

## 2024-06-06 DIAGNOSIS — F330 Major depressive disorder, recurrent, mild: Secondary | ICD-10-CM

## 2024-06-06 MED ORDER — BUPROPION HCL ER (XL) 150 MG PO TB24: 150.0000 mg | ORAL_TABLET | Freq: Every day | ORAL | 2 refills | Status: DC

## 2024-06-06 MED ORDER — ALPRAZOLAM 1 MG PO TABS: 1.0000 mg | ORAL_TABLET | Freq: Two times a day (BID) | ORAL | 2 refills | Status: DC

## 2024-06-06 NOTE — Progress Notes (Signed)
 [facility] MD Virtual Progress Note   Patient Location: Home Provider Location: Home Office  I connect with patient by video and verified that I am speaking with correct person by using two identifiers. I discussed the limitations of evaluation and management by telemedicine and the availability of in person appointments. I also discussed with the patient that there may be a patient responsible charge related to this service. The patient expressed understanding and agreed to proceed.  Jackie Goodman [id number] 68 y.o.  06/06/2024 10:34 AM  History of Present Illness:  Patient is evaluated by video session.  She reported another close friend died in a motor vehicle accident.  Patient told one of her grandchild's best friend died while on vacation.  Patient has a lot of losses in the past year and a half and she struggles with grief.  She feels sad and isolated.  She is also very concerned about her chronic health issues.  Recent blood work shows potassium 5.2 and GFR low.  Her creatinine still 2.6.  She is seeing rheumatologist and kidney doctor at [facility].  Patient has chronic pain.  She noticed lately not sleeping very well and have a lot of racing thoughts.  But she denies any suicidal thoughts or homicidal thoughts but a lot of ruminative thoughts.  She has panic attacks.  She takes Xanax  which usually helps her sleep but lately not working for her.  She go to sleep and then wake up in the middle of the night and had a hard time going back to sleep again.  She has no tremor or shakes or any EPS.  She has no paranoia or any hallucination.  Her appetite is fair.  Her energy level is low.  She is taking Wellbutrin  and so far tolerating okay and has no tremor or shakes or any EPS.  Patient was also had shingles and given Valtrex and taking pregabalin.  Past Psychiatric History: H/O depression since lupus in 1993.  Saw psychiatrist at [facility] but not happy with the care.  Seeing this Clinical research associate  since April 2009.  Tried Zoloft  in the past.  No h/o inpatient treatment, paranoia or any suicidal attempt.   Past Medical History:  Diagnosis Date   CRI (chronic renal insufficiency)    Headache(784.0)    HTN (hypertension)    Lupus    Myositis    Rosacea     Outpatient Encounter Medications as of 06/06/2024  Medication Sig   ALPRAZolam  (XANAX ) 1 MG tablet Take 1 tablet (1 mg total) by mouth 2 (two) times daily.   amLODipine (NORVASC) 5 MG tablet Take 5 mg by mouth daily.   buPROPion  (WELLBUTRIN  XL) 150 MG 24 hr tablet Take 1 tablet (150 mg total) by mouth daily.   clobetasol (TEMOVATE) 0.05 % external solution Apply 1 application topically 2 (two) times daily.    fluticasone (FLONASE) 50 MCG/ACT nasal spray Place 1 spray into the nose daily.    hydroxychloroquine  (PLAQUENIL ) 200 MG tablet Take 200 mg by mouth 2 (two) times daily.    mycophenolate  (MYFORTIC) 360 MG TBEC EC tablet Take 360 mg by mouth 2 (two) times daily.   omeprazole (PRILOSEC) 20 MG capsule Take 20 mg by mouth 2 (two) times daily before a meal.   sodium bicarbonate 325 MG tablet Take 650 mg by mouth 2 (two) times daily.   sodium zirconium cyclosilicate (LOKELMA) 10 g PACK packet Take 10 g by mouth 3 (three) times daily.   SUMAtriptan (IMITREX) 5 MG/ACT  nasal spray Place 1 spray into the nose every 2 (two) hours as needed for migraine.   No facility-administered encounter medications on file as of 06/06/2024.    No results found for this or any previous visit (from the past 2160 hours).   Psychiatric Specialty Exam: Physical Exam  Review of Systems  Weight 136 lb (61.7 kg).There is no height or weight on file to calculate BMI.  General Appearance: Casual  Eye Contact:  Good  Speech:  Clear and Coherent  Volume:  Normal  Mood:  Euthymic  Affect:  Appropriate  Thought Process:  Goal Directed  Orientation:  Full (Time, Place, and Person)  Thought Content:  Rumination  Suicidal Thoughts:  No  Homicidal  Thoughts:  No  Memory:  Immediate;   Good Recent;   Good Remote;   Good  Judgement:  Good  Insight:  Good  Psychomotor Activity:  Normal  Concentration:  Concentration: Good and Attention Span: Good  Recall:  Good  Fund of Knowledge:  Good  Language:  Good  Akathisia:  No  Handed:  Right  AIMS (if indicated):     Assets:  Communication Skills Desire for Improvement Housing Transportation  ADL's:  Intact  Cognition:  WNL  Sleep:  fair       03/06/2024   10:47 AM  Depression screen PHQ 2/9  Decreased Interest 0  Down, Depressed, Hopeless 0  PHQ - 2 Score 0    Assessment/Plan: Mild episode of recurrent major depressive disorder - Plan: buPROPion  (WELLBUTRIN  XL) 150 MG 24 hr tablet, ALPRAZolam  (XANAX ) 1 MG tablet  Anxiety - Plan: buPROPion  (WELLBUTRIN  XL) 150 MG 24 hr tablet, ALPRAZolam  (XANAX ) 1 MG tablet  Grief  Patient is 68 year old female with history of hypertension, headaches, multiple sclerosis, lupus, polymyositis, anemia chronic pain, chronic renal insufficiency, major depressive disorder, panic attacks and now through going grief.  I reviewed blood work results and collect information from other providers.  BUN 26, creatinine 2.6 she has low RBC and hemoglobin.  She is on pregabalin, steroids, headache medicine, Plaquenil .  She also lost multiple friends and family members in a year.  I recommend should consider grief counseling to help her coping skills.  She also not sleeping very well and recommend to consider melatonin which she has never tried before.  She admitted not compliant with diet and need to be on a low-sodium and low potassium diet to help her potassium and blood pressure.  She is going to work on her diet.  She agreed to see a therapist and we will refer her for therapy to help her coping skills discussed about grief loss.  She will try melatonin up to 10 mg, continue Xanax  1 mg twice a day and Wellbutrin  XL 150 mg in the morning.  The patient continued to  have issues with insomnia we will consider a low-dose mirtazapine.   Follow Up Instructions:     I discussed the assessment and treatment plan with the patient. The patient was provided an opportunity to ask questions and all were answered. The patient agreed with the plan and demonstrated an understanding of the instructions.   The patient was advised to call back or seek an in-person evaluation if the symptoms worsen or if the condition fails to improve as anticipated.    Collaboration of Care: Other provider involved in patient's care AEB notes are available in epic to review  Patient/Guardian was advised Release of Information must be obtained prior to any record release  in order to collaborate their care with an outside provider. Patient/Guardian was advised if they have not already done so to contact the registration department to sign all necessary forms in order for us  to release information regarding their care.   Consent: Patient/Guardian gives verbal consent for treatment and assignment of benefits for services provided during this visit. Patient/Guardian expressed understanding and agreed to proceed.     Total encounter time 36 minutes which includes face-to-face time, chart reviewed, care coordination, order entry and documentation during this encounter.   Note: This document was prepared by [employer] voice dictation technology and any errors that results from this process are unintentional.    Leni ONEIDA Client, MD 06/06/2024

## 2024-06-12 DIAGNOSIS — M75111 Incomplete rotator cuff tear or rupture of right shoulder, not specified as traumatic: Secondary | ICD-10-CM | POA: Diagnosis not present

## 2024-06-12 DIAGNOSIS — M7521 Bicipital tendinitis, right shoulder: Secondary | ICD-10-CM | POA: Diagnosis not present

## 2024-08-16 ENCOUNTER — Ambulatory Visit (HOSPITAL_COMMUNITY): Admitting: Clinical

## 2024-09-05 ENCOUNTER — Encounter (HOSPITAL_COMMUNITY): Payer: Self-pay | Admitting: Psychiatry

## 2024-09-05 ENCOUNTER — Telehealth (HOSPITAL_BASED_OUTPATIENT_CLINIC_OR_DEPARTMENT_OTHER): Admitting: Psychiatry

## 2024-09-05 VITALS — Wt 131.0 lb

## 2024-09-05 DIAGNOSIS — F419 Anxiety disorder, unspecified: Secondary | ICD-10-CM

## 2024-09-05 DIAGNOSIS — F4321 Adjustment disorder with depressed mood: Secondary | ICD-10-CM | POA: Diagnosis not present

## 2024-09-05 DIAGNOSIS — F330 Major depressive disorder, recurrent, mild: Secondary | ICD-10-CM

## 2024-09-05 MED ORDER — ALPRAZOLAM 1 MG PO TABS: 1.0000 mg | ORAL_TABLET | Freq: Two times a day (BID) | ORAL | 2 refills | Status: AC

## 2024-09-05 MED ORDER — BUPROPION HCL ER (XL) 150 MG PO TB24: 150.0000 mg | ORAL_TABLET | Freq: Every day | ORAL | 2 refills | Status: AC

## 2024-09-05 NOTE — Progress Notes (Signed)
 " [facility] MD Virtual Progress Note   Patient Location: Home Provider Location: Home Office  I connect with patient by video and verified that I am speaking with correct person by using two identifiers. I discussed the limitations of evaluation and management by telemedicine and the availability of in person appointments. I also discussed with the patient that there may be a patient responsible charge related to this service. The patient expressed understanding and agreed to proceed.  Jackie Goodman [phone] 69 y.o.  09/05/2024 10:09 AM  History of Present Illness:  Patient is evaluated by video session.  She reported right before Christmas she had a flareup of lupus and received injection infusion at [facility].  Her GFR was very low but after the infusion it is getting better.  Her last kidney function shows creatinine 2.4 which is stable.  She reported a lot of fatigue around Christmas because of health condition.  She overall feels okay and denies any crying spells or any feeling of hopelessness or worthlessness.  Her appetite is fair.  Her sleep is okay.  She denies any suicidal thoughts or homicidal thoughts.  We have referred her for therapy but patient missed the appointment but agreed to call back to reschedule appointment.  She reported losing a lot of family members and friend in recent years has caused a lot of grief and like to work with a therapist to help her coping skills and going through grief process.  She is taking Wellbutrin  and Xanax .  She denies drinking or using any illegal substances.  She has no tremor or shakes or any EPS. She denies any psychosis, mania, hallucination or major panic attack.  Past Psychiatric History: H/O depression since lupus in 1993.  Saw psychiatrist at [facility] but not happy with the care.  Seeing this clinical research associate since April 2009.  Tried Zoloft  in the past.  No h/o inpatient treatment, paranoia or any suicidal attempt.   Past Medical History:   Diagnosis Date   CRI (chronic renal insufficiency)    Headache(784.0)    HTN (hypertension)    Lupus    Myositis    Rosacea     Outpatient Encounter Medications as of 09/05/2024  Medication Sig   ALPRAZolam  (XANAX ) 1 MG tablet Take 1 tablet (1 mg total) by mouth 2 (two) times daily.   amLODipine (NORVASC) 5 MG tablet Take 5 mg by mouth daily.   buPROPion  (WELLBUTRIN  XL) 150 MG 24 hr tablet Take 1 tablet (150 mg total) by mouth daily.   clobetasol (TEMOVATE) 0.05 % external solution Apply 1 application topically 2 (two) times daily.    fluticasone (FLONASE) 50 MCG/ACT nasal spray Place 1 spray into the nose daily.    hydroxychloroquine  (PLAQUENIL ) 200 MG tablet Take 200 mg by mouth 2 (two) times daily.    mycophenolate  (MYFORTIC) 360 MG TBEC EC tablet Take 360 mg by mouth 2 (two) times daily.   omeprazole (PRILOSEC) 20 MG capsule Take 20 mg by mouth 2 (two) times daily before a meal.   sodium bicarbonate 325 MG tablet Take 650 mg by mouth 2 (two) times daily.   sodium zirconium cyclosilicate (LOKELMA) 10 g PACK packet Take 10 g by mouth 3 (three) times daily.   SUMAtriptan (IMITREX) 5 MG/ACT nasal spray Place 1 spray into the nose every 2 (two) hours as needed for migraine.   No facility-administered encounter medications on file as of 09/05/2024.    No results found for this or any previous visit (from the  past 2160 hours).   Psychiatric Specialty Exam: Physical Exam  Review of Systems  Weight 131 lb (59.4 kg).There is no height or weight on file to calculate BMI.  General Appearance: Casual  Eye Contact:  Good  Speech:  Clear and Coherent  Volume:  Normal  Mood:  Euthymic  Affect:  Appropriate  Thought Process:  Goal Directed  Orientation:  Full (Time, Place, and Person)  Thought Content:  WDL  Suicidal Thoughts:  No  Homicidal Thoughts:  No  Memory:  Immediate;   Good Recent;   Good Remote;   Good  Judgement:  Good  Insight:  Good  Psychomotor Activity:  Normal   Concentration:  Concentration: Good and Attention Span: Good  Recall:  Good  Fund of Knowledge:  Good  Language:  Good  Akathisia:  No  Handed:  Right  AIMS (if indicated):     Assets:  Communication Skills Desire for Improvement Housing Transportation  ADL's:  Intact  Cognition:  WNL  Sleep:  ok       03/06/2024   10:47 AM  Depression screen PHQ 2/9  Decreased Interest 0  Down, Depressed, Hopeless 0  PHQ - 2 Score 0    Assessment/Plan: Mild episode of recurrent major depressive disorder - Plan: ALPRAZolam  (XANAX ) 1 MG tablet, buPROPion  (WELLBUTRIN  XL) 150 MG 24 hr tablet  Anxiety - Plan: ALPRAZolam  (XANAX ) 1 MG tablet, buPROPion  (WELLBUTRIN  XL) 150 MG 24 hr tablet  Patient is 69 year old female with history of hypertension, headaches, multiple sclerosis, lupus, polymyositis, anemia chronic pain, chronic renal insufficiency, major depressive disorder, panic attacks and recently had flareup of lupus requires infusion.  Since started new medication her kidney functions are stable.  Still high creatinine and BUN but back to baseline.  Reviewed blood work results and creatinine formation.  Discussed grief, patient missed appointment for therapy appointment but agreed to give call back again to schedule appointment.  Her sleep is better since her kidney function improved.  Continue Xanax  1 mg twice a day and Wellbutrin  XL 150 mg in the morning.  Recommend to call back if she is any question or any concern.  We have recommended to trial low-dose melatonin if needed for insomnia.  Other possibility is to trial low-dose mirtazapine if melatonin did not work.  So far sleep is better than before.   Follow Up Instructions:     I discussed the assessment and treatment plan with the patient. The patient was provided an opportunity to ask questions and all were answered. The patient agreed with the plan and demonstrated an understanding of the instructions.   The patient was advised to call  back or seek an in-person evaluation if the symptoms worsen or if the condition fails to improve as anticipated.    Collaboration of Care: Other provider involved in patient's care AEB notes are available in epic to review  Patient/Guardian was advised Release of Information must be obtained prior to any record release in order to collaborate their care with an outside provider. Patient/Guardian was advised if they have not already done so to contact the registration department to sign all necessary forms in order for us  to release information regarding their care.   Consent: Patient/Guardian gives verbal consent for treatment and assignment of benefits for services provided during this visit. Patient/Guardian expressed understanding and agreed to proceed.     Total encounter time 28 minutes which includes face-to-face time, chart reviewed, care coordination, order entry and documentation during this encounter.  Note: This document was prepared by [employer] voice dictation technology and any errors that results from this process are unintentional.    Leni ONEIDA Client, MD 09/05/2024   "

## 2024-12-04 ENCOUNTER — Telehealth (HOSPITAL_COMMUNITY): Admitting: Psychiatry
